# Patient Record
Sex: Female | Born: 1995 | Hispanic: No | Marital: Single | Smoking: Never smoker
Health system: Southern US, Community
[De-identification: ages and names within clinical notes are randomized; demographics above are authoritative.]

## PROBLEM LIST (undated history)

## (undated) ENCOUNTER — Inpatient Hospital Stay (HOSPITAL_COMMUNITY): Payer: Self-pay

## (undated) DIAGNOSIS — O09299 Supervision of pregnancy with other poor reproductive or obstetric history, unspecified trimester: Secondary | ICD-10-CM

## (undated) DIAGNOSIS — F419 Anxiety disorder, unspecified: Secondary | ICD-10-CM

## (undated) DIAGNOSIS — O139 Gestational [pregnancy-induced] hypertension without significant proteinuria, unspecified trimester: Secondary | ICD-10-CM

## (undated) DIAGNOSIS — O26899 Other specified pregnancy related conditions, unspecified trimester: Secondary | ICD-10-CM

## (undated) HISTORY — PX: WISDOM TOOTH EXTRACTION: SHX21

## (undated) HISTORY — DX: Gestational (pregnancy-induced) hypertension without significant proteinuria, unspecified trimester: O13.9

---
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
## 2016-04-05 ENCOUNTER — Ambulatory Visit (INDEPENDENT_AMBULATORY_CARE_PROVIDER_SITE_OTHER): Payer: BLUE CROSS/BLUE SHIELD

## 2016-04-05 ENCOUNTER — Ambulatory Visit (INDEPENDENT_AMBULATORY_CARE_PROVIDER_SITE_OTHER): Payer: BLUE CROSS/BLUE SHIELD | Admitting: Physician Assistant

## 2016-04-05 VITALS — BP 122/72 | HR 103 | Temp 98.1°F | Resp 17 | Ht 64.0 in | Wt 126.0 lb

## 2016-04-05 DIAGNOSIS — R1084 Generalized abdominal pain: Secondary | ICD-10-CM

## 2016-04-05 DIAGNOSIS — K5900 Constipation, unspecified: Secondary | ICD-10-CM

## 2016-04-05 MED ORDER — POLYETHYLENE GLYCOL 3350 17 GM/SCOOP PO POWD: 17.0000 g | Freq: Every day | ORAL | 1 refills | Status: DC | PRN

## 2016-04-05 NOTE — Patient Instructions (Addendum)
For constipation   Make sure you are drinking enough water daily. Make sure you are getting enough fiber in your diet - this will make you regular - you can eat high fiber foods or use metamucil as a supplement - it is really important to drink enough water when using fiber supplements.  If your stools are hard or are formed balls or you have to strain a stool softener will help - use colace 2-3 capsule a day  For gentle treatment of constipation Use Miralax 1-2 capfuls a day until your stools are soft and regular and then decrease the usage - you can use this daily  For more aggressive treatment of constipation Use 4 capfuls of Colace and 6 doses of Miralax and drink it in 2 hours - this should result in several watery stools - if it does not repeat the next day and then go to daily miralax for a week to make sure your bowels are clean and retrained to work properly  For the most aggressive treatment of constipation Use 14 capfuls of Miralax in 1 gallon of fluid (gatoraid or water work well or a combination of the two) and drink over 12h - it is ok to eat during this time and then use Miralax 1 capful daily for about 2 weeks to prevent the constipation from returning    IF you received an x-ray today, you will receive an invoice from [facility]. Please contact  Radiology at [phone] with questions or concerns regarding your invoice.   IF you received labwork today, you will receive an invoice from [facility]. Please contact [facility] at [phone] with questions or concerns regarding your invoice.   Our billing staff will not be able to assist you with questions regarding bills from these companies.  You will be contacted with the lab results as soon as they are available. The fastest way to get your results is to activate your My Chart account. Instructions are located on the last page of this paperwork. If you have not heard from us regarding the results in 2 weeks,  please contact this office.     

## 2016-04-05 NOTE — Progress Notes (Signed)
   Suzanne Flores  MRN: [id number] DOB: 06/01/1995  Subjective:  Pt presents to clinic with a week h/o generalized abd pain that has been getting worse.  Her appetite is down sometimes from nausea after eating and other times because she in not interested in eating. She will also have nausea not associated with food.  She has been been constipated since Nov - feels like she has to go but is able to go.  She has been drinking about 30oz water a day.  Rare soda.  No sick contacts with similar issues.  Recent laid off - 10ds ago - she has been stressed out because of that but she typicaly gets headaches from stress and not abdominal pain/nausea.  Review of Systems  Constitutional: Positive for chills. Negative for fever.  Gastrointestinal: Positive for abdominal pain, constipation and nausea. Negative for diarrhea and vomiting.  Genitourinary: Negative.     There are no active problems to display for this patient.   No current outpatient prescriptions on file prior to visit.   No current facility-administered medications on file prior to visit.     No Known Allergies  Pt patients past, family and social history were reviewed and updated.   Objective:  BP 122/72 (BP Location: Right Arm, Patient Position: Sitting, Cuff Size: Normal)   Pulse (!) 103   Temp 98.1 F (36.7 C) (Oral)   Resp 17   Ht 5\' 4"  (1.626 m)   Wt 126 lb (57.2 kg)   LMP 03/18/2016 (Approximate)   SpO2 98%   BMI 21.63 kg/m   Physical Exam  Constitutional: She is oriented to person, place, and time and well-developed, well-nourished, and in no distress.  HENT:  Head: Normocephalic and atraumatic.  Right Ear: Hearing and external ear normal.  Left Ear: Hearing and external ear normal.  Eyes: Conjunctivae are normal.  Neck: Normal range of motion.  Cardiovascular: Normal rate, regular rhythm and normal heart sounds.   No murmur heard. Pulmonary/Chest: Effort normal and breath sounds normal. She has no wheezes.    Abdominal: There is tenderness (generalized ).  Neurological: She is alert and oriented to person, place, and time. Gait normal.  Skin: Skin is warm and dry.  Psychiatric: Mood, memory, affect and judgment normal.  Vitals reviewed.   Dg Abd 1 View  Result Date: 04/05/2016 CLINICAL DATA:  Abdominal pain for 6 days. EXAM: ABDOMEN - 1 VIEW COMPARISON:  None. FINDINGS: The bowel gas pattern is normal. No radio-opaque calculi or other significant radiographic abnormality are seen. IMPRESSION: Normal exam. Electronically Signed   By: Drusilla Kannerhomas  Dalessio M.D.   On: 04/05/2016 09:49    Assessment and Plan :  Generalized abdominal pain - Plan: DG Abd 1 View  Constipation, unspecified constipation type - Plan: DG Abd 1 View, polyethylene glycol powder (GLYCOLAX/MIRALAX) powder - d/w pt how to treat her constipation - we discussed different ways of doing it and gave her information to take home about the different methods.  She will contact me and recheck if things are not getting better.  Benny LennertSarah Weber PA-C  [facility] at [facility] 04/05/2016 10:10 AM

## 2016-06-14 ENCOUNTER — Encounter (HOSPITAL_COMMUNITY): Payer: Self-pay

## 2016-06-14 ENCOUNTER — Emergency Department (HOSPITAL_COMMUNITY): Payer: BLUE CROSS/BLUE SHIELD

## 2016-06-14 ENCOUNTER — Emergency Department (HOSPITAL_COMMUNITY)
Admission: EM | Admit: 2016-06-14 | Discharge: 2016-06-14 | Disposition: A | Discharge status: Home / Self Care | Payer: BLUE CROSS/BLUE SHIELD | Attending: Emergency Medicine | Admitting: Emergency Medicine

## 2016-06-14 DIAGNOSIS — R1084 Generalized abdominal pain: Secondary | ICD-10-CM | POA: Insufficient documentation

## 2016-06-14 DIAGNOSIS — Z3A01 Less than 8 weeks gestation of pregnancy: Secondary | ICD-10-CM | POA: Insufficient documentation

## 2016-06-14 DIAGNOSIS — O26891 Other specified pregnancy related conditions, first trimester: Secondary | ICD-10-CM | POA: Diagnosis present

## 2016-06-14 DIAGNOSIS — R52 Pain, unspecified: Secondary | ICD-10-CM

## 2016-06-14 DIAGNOSIS — Z79899 Other long term (current) drug therapy: Secondary | ICD-10-CM | POA: Insufficient documentation

## 2016-06-14 DIAGNOSIS — Z3490 Encounter for supervision of normal pregnancy, unspecified, unspecified trimester: Secondary | ICD-10-CM

## 2016-06-14 LAB — COMPREHENSIVE METABOLIC PANEL
ALT: 13 U/L — ABNORMAL LOW (ref 14–54)
AST: 18 U/L (ref 15–41)
Albumin: 4.6 g/dL (ref 3.5–5.0)
Alkaline Phosphatase: 82 U/L (ref 38–126)
Anion gap: 8 (ref 5–15)
BUN: 9 mg/dL (ref 6–20)
CHLORIDE: 106 mmol/L (ref 101–111)
CO2: 23 mmol/L (ref 22–32)
Calcium: 10 mg/dL (ref 8.9–10.3)
Creatinine, Ser: 0.67 mg/dL (ref 0.44–1.00)
GFR calc Af Amer: >60 mL/min (ref >60)
GFR calc non Af Amer: >60 mL/min (ref >60)
Glucose, Bld: 91 mg/dL (ref 65–99)
POTASSIUM: 3.7 mmol/L (ref 3.5–5.1)
Sodium: 137 mmol/L (ref 135–145)
Total Bilirubin: 0.9 mg/dL (ref 0.3–1.2)
Total Protein: 8.5 g/dL — ABNORMAL HIGH (ref 6.5–8.1)

## 2016-06-14 LAB — URINALYSIS, ROUTINE W REFLEX MICROSCOPIC
BILIRUBIN URINE: NEGATIVE
Bacteria, UA: NONE SEEN
Glucose, UA: NEGATIVE mg/dL
Hgb urine dipstick: NEGATIVE
Ketones, ur: NEGATIVE mg/dL
Nitrite: NEGATIVE
PH: 7 (ref 5.0–8.0)
Protein, ur: NEGATIVE mg/dL
SPECIFIC GRAVITY, URINE: 1.011 (ref 1.005–1.030)

## 2016-06-14 LAB — WET PREP, GENITAL
CLUE CELLS WET PREP: NONE SEEN
SPERM: NONE SEEN
Trich, Wet Prep: NONE SEEN
Yeast Wet Prep HPF POC: NONE SEEN

## 2016-06-14 LAB — CBC
HCT: 40.3 % (ref 36.0–46.0)
Hemoglobin: 13.5 g/dL (ref 12.0–15.0)
MCH: 27.3 pg (ref 26.0–34.0)
MCHC: 33.5 g/dL (ref 30.0–36.0)
MCV: 81.6 fL (ref 78.0–100.0)
PLATELETS: 290 10*3/uL (ref 150–400)
RBC: 4.94 MIL/uL (ref 3.87–5.11)
RDW: 12.8 % (ref 11.5–15.5)
WBC: 8.9 10*3/uL (ref 4.0–10.5)

## 2016-06-14 LAB — I-STAT BETA HCG BLOOD, ED (MC, WL, AP ONLY): I-stat hCG, quantitative: 1071.9 m[IU]/mL — ABNORMAL HIGH (ref <5)

## 2016-06-14 LAB — LIPASE, BLOOD: Lipase: 22 U/L (ref 11–51)

## 2016-06-14 MED ORDER — SODIUM CHLORIDE 0.9 % IV BOLUS (SEPSIS)
1000.0000 mL | Freq: Once | INTRAVENOUS | Status: AC
  Administered 2016-06-14: 1000 mL via INTRAVENOUS

## 2016-06-14 MED ORDER — PRENATAL VITAMIN 27-0.8 MG PO TABS: 1.0000 | ORAL_TABLET | Freq: Every day | ORAL | 0 refills | Status: DC

## 2016-06-14 NOTE — ED Triage Notes (Signed)
Patient c/o mid abdominal pain x 6 days ago. Patient states she had nausea 2 days ago. No diarrhea no fever.

## 2016-06-14 NOTE — ED Notes (Signed)
Pt was assisted to restroom.

## 2016-06-14 NOTE — ED Notes (Signed)
Ultrasound at the bedside

## 2016-06-14 NOTE — ED Provider Notes (Signed)
[facility]-EMERGENCY DEPT Provider Note   CSN: [id number] Arrival date & time: 06/14/16  0911     History   Chief Complaint Chief Complaint  Patient presents with  . Abdominal Pain    HPI Suzanne Flores is a 21 y.o. female.  Pt presents to the ED today with diffuse abdominal pain.  She has had nausea, but not vomiting.  She denies constipation or diarrhea.  She denies f/c.      History reviewed. No pertinent past medical history.  There are no active problems to display for this patient.   History reviewed. No pertinent surgical history.  OB History    No data available       Home Medications    Prior to Admission medications   Medication Sig Start Date End Date Taking? Authorizing Provider  polyethylene glycol powder (GLYCOLAX/MIRALAX) powder Take 17 g by mouth daily as needed. Patient not taking: Reported on 06/14/2016 04/05/16   Morrell RiddleSarah L Weber, PA-C  Prenatal Vit-Fe Fumarate-FA (PRENATAL VITAMIN) 27-0.8 MG TABS Take 1 capsule by mouth daily. 06/14/16   Jacalyn LefevreJulie Lemario Chaikin, MD    Family History History reviewed. No pertinent family history.  Social History Social History  Substance Use Topics  . Smoking status: Never Smoker  . Smokeless tobacco: Never Used  . Alcohol use No     Allergies   Patient has no known allergies.   Review of Systems Review of Systems  Gastrointestinal: Positive for abdominal pain.  All other systems reviewed and are negative.    Physical Exam Updated Vital Signs BP 109/68   Pulse (!) 105   Temp 98.5 F (36.9 C) (Oral)   Resp 12   Ht 5\' 4"  (1.626 m)   Wt 125 lb (56.7 kg)   LMP 05/17/2016   SpO2 100%   BMI 21.46 kg/m   Physical Exam  Constitutional: She is oriented to person, place, and time. She appears well-developed and well-nourished.  HENT:  Head: Normocephalic and atraumatic.  Right Ear: External ear normal.  Left Ear: External ear normal.  Nose: Nose normal.  Mouth/Throat: Oropharynx is clear and moist.  Eyes:  Conjunctivae and EOM are normal. Pupils are equal, round, and reactive to light.  Neck: Normal range of motion. Neck supple.  Cardiovascular: Normal rate, regular rhythm, normal heart sounds and intact distal pulses.   Pulmonary/Chest: Effort normal and breath sounds normal.  Abdominal: Soft. Bowel sounds are normal. There is generalized tenderness.  Genitourinary: Cervix exhibits discharge. Cervix exhibits no motion tenderness. Right adnexum displays no tenderness. Left adnexum displays no tenderness. Vaginal discharge found.  Musculoskeletal: Normal range of motion.  Neurological: She is alert and oriented to person, place, and time.  Skin: Skin is warm.  Psychiatric: She has a normal mood and affect. Her behavior is normal. Judgment and thought content normal.  Nursing note and vitals reviewed.    ED Treatments / Results  Labs (all labs ordered are listed, but only abnormal results are displayed) Labs Reviewed  WET PREP, GENITAL - Abnormal; Notable for the following:       Result Value   WBC, Wet Prep HPF POC MANY (*)    All other components within normal limits  COMPREHENSIVE METABOLIC PANEL - Abnormal; Notable for the following:    Total Protein 8.5 (*)    ALT 13 (*)    All other components within normal limits  URINALYSIS, ROUTINE W REFLEX MICROSCOPIC - Abnormal; Notable for the following:    Leukocytes, UA MODERATE (*)  Squamous Epithelial / LPF 0-5 (*)    All other components within normal limits  I-STAT BETA HCG BLOOD, ED ([facility], [facility], [facility] ONLY) - Abnormal; Notable for the following:    I-stat hCG, quantitative 1,071.9 (*)    All other components within normal limits  LIPASE, BLOOD  CBC  GC/CHLAMYDIA PROBE AMP () NOT AT [facility]    EKG  EKG Interpretation None       Radiology US Ob Comp Less 14 Wks  Result Date: 06/14/2016 CLINICAL DATA:  Mid abdominal pain for 6 days, nausea for 2 days. Gestational age by last menstrual four weeks and 1 day. Beta HCG  1,072. EXAM: OBSTETRIC <14 WK [place] AND TRANSVAGINAL OB US TECHNIQUE: Both transabdominal and transvaginal ultrasound examinations were performed for complete evaluation of the gestation as well as the maternal uterus, adnexal regions, and pelvic cul-de-sac. Transvaginal technique was performed to assess early pregnancy. COMPARISON:  None. FINDINGS: Intrauterine gestational sac: Present Yolk sac:  Not present Embryo:  Not present Cardiac Activity: Not present MSD: 2.1  mm   4 w   6  d Subchorionic hemorrhage:  Small subchorionic hemorrhage. Maternal uterus/adnexae: Small to moderate amount of free fluid. 2.1 cm echogenic LEFT ovarian probable corpus luteal cyst with peripheral vascularity. IMPRESSION: Early intrauterine gestational sac, but no yolk sac, fetal pole, or cardiac activity yet visualized. Small subchorionic hemorrhage. Recommend follow-up quantitative B-HCG levels and follow-up US in 14 days to assess viability. This recommendation follows SRU consensus guidelines: Diagnostic Criteria for Nonviable Pregnancy Early in the First Trimester. Malva Limes Med 2013; 960:4540-98. Electronically Signed   By: Awilda Metro M.D.   On: 06/14/2016 13:49   US Ob Transvaginal  Result Date: 06/14/2016 CLINICAL DATA:  Mid abdominal pain for 6 days, nausea for 2 days. Gestational age by last menstrual four weeks and 1 day. Beta HCG 1,072. EXAM: OBSTETRIC <14 WK [place] AND TRANSVAGINAL OB US TECHNIQUE: Both transabdominal and transvaginal ultrasound examinations were performed for complete evaluation of the gestation as well as the maternal uterus, adnexal regions, and pelvic cul-de-sac. Transvaginal technique was performed to assess early pregnancy. COMPARISON:  None. FINDINGS: Intrauterine gestational sac: Present Yolk sac:  Not present Embryo:  Not present Cardiac Activity: Not present MSD: 2.1  mm   4 w   6  d Subchorionic hemorrhage:  Small subchorionic hemorrhage. Maternal uterus/adnexae: Small to moderate amount of  free fluid. 2.1 cm echogenic LEFT ovarian probable corpus luteal cyst with peripheral vascularity. IMPRESSION: Early intrauterine gestational sac, but no yolk sac, fetal pole, or cardiac activity yet visualized. Small subchorionic hemorrhage. Recommend follow-up quantitative B-HCG levels and follow-up US in 14 days to assess viability. This recommendation follows SRU consensus guidelines: Diagnostic Criteria for Nonviable Pregnancy Early in the First Trimester. Malva Limes Med 2013; 119:1478-29. Electronically Signed   By: Awilda Metro M.D.   On: 06/14/2016 13:49    Procedures Procedures (including critical care time)  Medications Ordered in ED Medications  sodium chloride 0.9 % bolus 1,000 mL (0 mLs Intravenous Stopped 06/14/16 1250)     Initial Impression / Assessment and Plan / ED Course  I have reviewed the triage vital signs and the nursing notes.  Pertinent labs & imaging results that were available during my care of the patient were reviewed by me and considered in my medical decision making (see chart for details).   After I told the patient that her pregnancy test was positive, she said that she and her boyfriend  were trying.  The pt did take a home pregnancy test that was positive.  Pt is not having any vaginal bleeding.  She knows to follow up with ob.    Final Clinical Impressions(s) / ED Diagnoses   Final diagnoses:  Intrauterine pregnancy    New Prescriptions New Prescriptions   PRENATAL VIT-FE FUMARATE-FA (PRENATAL VITAMIN) 27-0.8 MG TABS    Take 1 capsule by mouth daily.     Jacalyn Lefevre, MD 06/14/16 [phone]

## 2016-07-26 ENCOUNTER — Encounter: Payer: BLUE CROSS/BLUE SHIELD | Admitting: Certified Nurse Midwife

## 2016-08-10 ENCOUNTER — Encounter: Payer: BLUE CROSS/BLUE SHIELD | Admitting: Certified Nurse Midwife

## 2016-11-23 DIAGNOSIS — O99019 Anemia complicating pregnancy, unspecified trimester: Secondary | ICD-10-CM | POA: Insufficient documentation

## 2017-01-13 ENCOUNTER — Encounter (HOSPITAL_COMMUNITY): Payer: Self-pay

## 2017-01-13 ENCOUNTER — Inpatient Hospital Stay (HOSPITAL_COMMUNITY)
Admission: AD | Admit: 2017-01-13 | Discharge: 2017-01-13 | Disposition: A | Discharge status: Home / Self Care | Payer: BLUE CROSS/BLUE SHIELD | Source: Ambulatory Visit | Attending: Obstetrics and Gynecology | Admitting: Obstetrics and Gynecology

## 2017-01-13 DIAGNOSIS — R109 Unspecified abdominal pain: Secondary | ICD-10-CM | POA: Insufficient documentation

## 2017-01-13 DIAGNOSIS — F329 Major depressive disorder, single episode, unspecified: Secondary | ICD-10-CM

## 2017-01-13 DIAGNOSIS — Z3A34 34 weeks gestation of pregnancy: Secondary | ICD-10-CM | POA: Insufficient documentation

## 2017-01-13 DIAGNOSIS — O4703 False labor before 37 completed weeks of gestation, third trimester: Secondary | ICD-10-CM

## 2017-01-13 DIAGNOSIS — R4589 Other symptoms and signs involving emotional state: Secondary | ICD-10-CM

## 2017-01-13 DIAGNOSIS — O26893 Other specified pregnancy related conditions, third trimester: Secondary | ICD-10-CM | POA: Insufficient documentation

## 2017-01-13 HISTORY — DX: Anxiety disorder, unspecified: F41.9

## 2017-01-13 LAB — CBC
HCT: 33.3 % — ABNORMAL LOW (ref 36.0–46.0)
Hemoglobin: 11.1 g/dL — ABNORMAL LOW (ref 12.0–15.0)
MCH: 27.1 pg (ref 26.0–34.0)
MCHC: 33.3 g/dL (ref 30.0–36.0)
MCV: 81.4 fL (ref 78.0–100.0)
Platelets: 279 10*3/uL (ref 150–400)
RBC: 4.09 MIL/uL (ref 3.87–5.11)
RDW: 13.2 % (ref 11.5–15.5)
WBC: 10.4 10*3/uL (ref 4.0–10.5)

## 2017-01-13 LAB — URINALYSIS, ROUTINE W REFLEX MICROSCOPIC
Bilirubin Urine: NEGATIVE
Glucose, UA: NEGATIVE mg/dL
Hgb urine dipstick: NEGATIVE
Ketones, ur: NEGATIVE mg/dL
Nitrite: NEGATIVE
Protein, ur: NEGATIVE mg/dL
Specific Gravity, Urine: 1.018 (ref 1.005–1.030)
pH: 6 (ref 5.0–8.0)

## 2017-01-13 MED ORDER — SERTRALINE HCL 25 MG PO TABS: 25.0000 mg | ORAL_TABLET | Freq: Every day | ORAL | 2 refills | Status: DC

## 2017-01-13 NOTE — MAU Provider Note (Signed)
History     CSN: [id number]  Arrival date & time 01/13/17  0920   None     Chief Complaint  Patient presents with  . Abdominal Pain    Suzanne Flores 21 y.o. G1P0 @ 3969w3d presents to MAU stating that she has had contractions from 2 pm yesterday until midnight last night. She states her contractions have spaced out and eased off since then.    Past Medical History:  Diagnosis Date  . Anxiety     Past Surgical History:  Procedure Laterality Date  . WISDOM TOOTH EXTRACTION      No family history on file.  Social History  Substance Use Topics  . Smoking status: Never Smoker  . Smokeless tobacco: Never Used  . Alcohol use No    OB History    Gravida Para Term Preterm AB Living   1             SAB TAB Ectopic Multiple Live Births                  Review of Systems  Gastrointestinal: Positive for abdominal pain.  All other systems reviewed and are negative.   Allergies  Patient has no known allergies.  Home Medications    BP 112/71   Pulse (!) 133   Temp 97.9 F (36.6 C) (Oral)   Resp 18   Ht 5\' 3"  (1.6 m)   Wt 175 lb (79.4 kg)   LMP 05/17/2016   BMI 31.00 kg/m   Physical Exam  Constitutional: She is oriented to person, place, and time. She appears well-developed and well-nourished.  HENT:  Head: Normocephalic and atraumatic.  Cardiovascular: Normal rate.   Pulmonary/Chest: Effort normal and breath sounds normal.  Abdominal: Soft.  Genitourinary: Vagina normal. No vaginal discharge found.  Musculoskeletal: Normal range of motion.  Neurological: She is alert and oriented to person, place, and time.  Skin: Skin is warm and dry.  Psychiatric: She has a normal mood and affect. Her behavior is normal. Thought content normal.  Nursing note and vitals reviewed.   MAU Course  Procedures (including critical care time)  Labs Reviewed  URINALYSIS, ROUTINE W REFLEX MICROSCOPIC - Abnormal; Notable for the following:       Result Value   Leukocytes, UA  TRACE (*)    Bacteria, UA RARE (*)    Squamous Epithelial / LPF 6-30 (*)    All other components within normal limits  CBC - Abnormal; Notable for the following:    Hemoglobin 11.1 (*)    HCT 33.3 (*)    All other components within normal limits   No results found.   No diagnosis found.    MDM  Pt has been monitored with no tracing of contractions since arrival. FHT's category 1. Cervix is closed/50/-3. Encouraged pushing fluids and resting today. Pt discussed having depressed mood  Most of the time. Denies feeling of suicide and harming oneself. Advised to start Zoloft at 25mg  and discuss at next office visit next Thursday. Pt is agreeable with plan Illene BolusLori Clemmons CNM

## 2017-01-13 NOTE — Discharge Instructions (Signed)
Sertraline oral solution What is this medicine? SERTRALINE (SER tra leen) is used to treat depression. It may also be used to treat obsessive compulsive disorder, panic disorder, post-trauma stress, premenstrual dysphoric disorder (PMDD) or social anxiety. This medicine may be used for other purposes; ask your health care provider or pharmacist if you have questions. COMMON BRAND NAME(S): Zoloft What should I tell my health care provider before I take this medicine? They need to know if you have any of these conditions: -bleeding disorders -bipolar disorder or a family history of bipolar disorder -glaucoma -heart disease -high blood pressure -history of irregular heartbeat -history of low levels of calcium, magnesium, or potassium in the blood -if you often drink alcohol -liver disease -receiving electroconvulsive therapy -seizures -suicidal thoughts, plans, or attempt; a previous suicide attempt by you or a family member -take medicines that treat or prevent blood clots -thyroid disease -an unusual or allergic reaction to sertraline, other medicines, foods, dyes, or preservatives -pregnant or trying to get pregnant -breast-feeding How should I use this medicine? Take this medicine by mouth. Follow the directions on the prescription label. Before taking your dose, you need to dilute the solution in a beverage. Measure your medicine dose using the dropper in the bottle. Next, place the measured dose in at least 4 ounces (one-half cup) of water, ginger-ale, lemon-lime soda, lemonade or orange juice and mix. Do not mix in grapefruit juice or in any other liquids other than those listed. Drink all of mixed liquid immediately. Do not mix the dose and store it for later. It must be taken right away. You may take this medicine with or without food. Take your medicine at regular intervals. Do not take your medicine more often than directed. Do not stop taking this medicine suddenly except upon the  advice of your doctor. Stopping this medicine too quickly may cause serious side effects or your condition may worsen. A special MedGuide will be given to you by the pharmacist with each prescription and refill. Be sure to read this information carefully each time. Talk to your pediatrician regarding the use of this medicine in children. While this drug may be prescribed for children as young as 7 years for selected conditions, precautions do apply. Overdosage: If you think you have taken too much of this medicine contact a poison control center or emergency room at once. NOTE: This medicine is only for you. Do not share this medicine with others. What if I miss a dose? If you miss a dose, take it as soon as you can. If it is almost time for your next dose, take only that dose. Do not take double or extra doses. What may interact with this medicine? Do not take this medicine with any of the following medications: -cisapride -dofetilide -dronedarone -linezolid -MAOIs like Carbex, Eldepryl, Marplan, Nardil, and Parnate -methylene blue (injected into a vein) -pimozide -thioridazine This medicine may also interact with the following medications: -alcohol -amphetamines -aspirin and aspirin-like medicines -certain medicines for depression, anxiety, or psychotic disturbances -certain medicines for fungal infections like ketoconazole, fluconazole, posaconazole, and itraconazole -certain medicines for irregular heart beat like flecainide, quinidine, propafenone -certain medicines for migraine headaches like almotriptan, eletriptan, frovatriptan, naratriptan, rizatriptan, sumatriptan, zolmitriptan -certain medicines for sleep -certain medicines for seizures like carbamazepine, valproic acid, phenytoin -certain medicines that treat or prevent blood clots like warfarin, enoxaparin, dalteparin -cimetidine -digoxin -diuretics -fentanyl -isoniazid -lithium -NSAIDs, medicines for pain and  inflammation, like ibuprofen or naproxen -other medicines that prolong the QT  interval (cause an abnormal heart rhythm) -rasagiline -safinamide -supplements like St. John's wort, kava kava, valerian -tolbutamide -tramadol -tryptophan This list may not describe all possible interactions. Give your health care provider a list of all the medicines, herbs, non-prescription drugs, or dietary supplements you use. Also tell them if you smoke, drink alcohol, or use illegal drugs. Some items may interact with your medicine. What should I watch for while using this medicine? Tell your doctor if your symptoms do not get better or if they get worse. Visit your doctor or health care professional for regular checks on your progress. Because it may take several weeks to see the full effects of this medicine, it is important to continue your treatment as prescribed by your doctor. Patients and their families should watch out for new or worsening thoughts of suicide or depression. Also watch out for sudden changes in feelings such as feeling anxious, agitated, panicky, irritable, hostile, aggressive, impulsive, severely restless, overly excited and hyperactive, or not being able to sleep. If this happens, especially at the beginning of treatment or after a change in dose, call your health care professional. Bonita Quin may get drowsy or dizzy. Do not drive, use machinery, or do anything that needs mental alertness until you know how this medicine affects you. Do not stand or sit up quickly, especially if you are an older patient. This reduces the risk of dizzy or fainting spells. Alcohol may interfere with the effect of this medicine. Avoid alcoholic drinks. This medicine contains a high percentage of alcohol that may interact with medicines used to treat alcohol abuse, like Antabuse (disulfiram). You should not take these medicines together. Your mouth may get dry. Chewing sugarless gum or sucking hard candy, and drinking  plenty of water may help. Contact your doctor if the problem does not go away or is severe. Some products may contain alcohol. Ask your pharmacist or healthcare provider if this medicine contains alcohol. Be sure to tell all healthcare providers you are taking this medicine. Certain medicines, like metronidazole and disulfiram, can cause an unpleasant reaction when taken with alcohol. The reaction includes flushing, headache, nausea, vomiting, sweating, and increased thirst. The reaction can last from 30 minutes to several hours. What side effects may I notice from receiving this medicine? Side effects that you should report to your doctor or health care professional as soon as possible: -allergic reactions like skin rash, itching or hives, swelling of the face, lips, or tongue -anxious -black, tarry stools -changes in vision -confusion -elevated mood, decreased need for sleep, racing thoughts, impulsive behavior -eye pain -fast, irregular heartbeat -feeling faint or lightheaded, falls -feeling agitated, angry, or irritable -hallucination, loss of contact with reality -loss of balance or coordination -loss of memory -painful or prolonged erections -restlessness, pacing, inability to keep still -seizures -stiff muscles -suicidal thoughts or other mood changes -trouble sleeping -unusual bleeding or bruising -unusually weak or tired -vomiting Side effects that usually do not require medical attention (report to your doctor or health care professional if they continue or are bothersome): -change in appetite or weight -change in sex drive or performance -diarrhea -increased sweating -indigestion, nausea -tremors This list may not describe all possible side effects. Call your doctor for medical advice about side effects. You may report side effects to FDA at 1-800-FDA-1088. Where should I keep my medicine? Keep out of the reach of children. Store at room temperature between 15 and 30  degrees C (59 and 86 degrees F). Throw away any unused medicine  after the expiration date. NOTE: This sheet is a summary. It may not cover all possible information. If you have questions about this medicine, talk to your doctor, pharmacist, or health care provider.  2018 Elsevier/Gold Standard (2016-03-11 13:50:42) Preventing Preterm Birth Preterm birth is when your baby is delivered between 20 weeks and 37 weeks of pregnancy. A full-term pregnancy lasts for at least 37 weeks. Preterm birth can be dangerous for your baby because the last few weeks of pregnancy are an important time for your baby's brain and lungs to grow. Many things can cause a baby to be born early. Sometimes the cause is not known. There are certain factors that make you more likely to experience preterm birth, such as:  Having a previous baby born preterm.  Being pregnant with twins or other multiples.  Having had fertility treatment.  Being overweight or underweight at the start of your pregnancy.  Having any of the following during pregnancy: ? An infection, including a urinary tract infection (UTI) or an STI (sexually transmitted infection). ? High blood pressure. ? Diabetes. ? Vaginal bleeding.  Being age 35 or older.  Being age 55 or younger.  Getting pregnant within 6 months of a previous pregnancy.  Suffering extreme stress or physical or emotional abuse during pregnancy.  Standing for long periods of time during pregnancy, such as working at a job that requires standing.  What are the risks? The most serious risk of preterm birth is that the baby may not survive. This is more likely to happen if a baby is born before 34 weeks. Other risks and complications of preterm birth may include your baby having:  Breathing problems.  Brain damage that affects movement and coordination (cerebral palsy).  Feeding difficulties.  Vision or hearing problems.  Infections or inflammation of the digestive tract  (colitis).  Developmental delays.  Learning disabilities.  Higher risk for diabetes, heart disease, and high blood pressure later in life.  What can I do to lower my risk? Medical care  The most important thing you can do to lower your risk for preterm birth is to get routine medical care during pregnancy (prenatal care). If you have a high risk of preterm birth, you may be referred to a health care provider who specializes in managing high-risk pregnancies (perinatologist). You may be given medicine to help prevent preterm birth. Lifestyle changes Certain lifestyle changes can also lower your risk of preterm birth:  Wait at least 6 months after a pregnancy to become pregnant again.  Try to plan pregnancy for when you are between 18 and 79 years old.  Get to a healthy weight before getting pregnant. If you are overweight, work with your health care provider to safely lose weight.  Do not use any products that contain nicotine or tobacco, such as cigarettes and e-cigarettes. If you need help quitting, ask your health care provider.  Do not drink alcohol.  Do not use drugs.  Where to find support: For more support, consider:  Talking with your health care provider.  Talking with a therapist or substance abuse counselor, if you need help quitting.  Working with a diet and nutrition specialist (dietitian) or a Systems analyst to maintain a healthy weight.  Joining a support group.  Where to find more information: Learn more about preventing preterm birth from:  Centers for Disease Control and Prevention: http://curry.org/  March of Dimes: marchofdimes.org/complications/premature-babies.aspx  American Pregnancy Association: americanpregnancy.org/labor-and-birth/premature-labor  Contact a health care provider if:  You have  any of the following signs of preterm labor before 37 weeks: ? A change or increase in vaginal  discharge. ? Fluid leaking from your vagina. ? Pressure or cramps in your lower abdomen. ? A backache that does not go away or gets worse. ? Regular tightening (contractions) in your lower abdomen. Summary  Preterm birth means having your baby during weeks 20-37 of pregnancy.  Preterm birth may put your baby at risk for physical and mental problems.  Getting good prenatal care can help prevent preterm birth.  You can lower your risk of preterm birth by making certain lifestyle changes, such as not smoking and not using alcohol. This information is not intended to replace advice given to you by your health care provider. Make sure you discuss any questions you have with your health care provider. Document Released: 04/21/2015 Document Revised: 11/14/2015 Document Reviewed: 11/14/2015 Elsevier Interactive Patient Education  [employer]2018 Elsevier Inc.

## 2017-01-13 NOTE — MAU Note (Signed)
Patient given water encourage to drink.

## 2017-01-13 NOTE — MAU Note (Addendum)
Intermittent pain from mid thigh to upper abdomen, denies N/V/D, denies vaginal bleeding.   Patient experiencing feeling sadness doesn't want to be around anyone.  Prior to pregnancy was on anxiety medications.

## 2017-01-27 LAB — OB RESULTS CONSOLE GBS: STREP GROUP B AG: NEGATIVE

## 2017-02-17 ENCOUNTER — Inpatient Hospital Stay (HOSPITAL_COMMUNITY): Payer: Medicaid Other | Admitting: Anesthesiology

## 2017-02-17 ENCOUNTER — Inpatient Hospital Stay (HOSPITAL_COMMUNITY)
Admission: AD | Admit: 2017-02-17 | Discharge: 2017-02-20 | DRG: 786 | Disposition: A | Discharge status: Home / Self Care | Payer: Medicaid Other | Source: Ambulatory Visit | Attending: Obstetrics and Gynecology | Admitting: Obstetrics and Gynecology

## 2017-02-17 ENCOUNTER — Encounter (HOSPITAL_COMMUNITY): Payer: Self-pay | Admitting: Anesthesiology

## 2017-02-17 ENCOUNTER — Other Ambulatory Visit: Payer: Self-pay

## 2017-02-17 ENCOUNTER — Encounter (HOSPITAL_COMMUNITY)
Admission: AD | Disposition: A | Discharge status: Home / Self Care | Payer: Self-pay | Source: Ambulatory Visit | Attending: Obstetrics and Gynecology

## 2017-02-17 ENCOUNTER — Encounter (HOSPITAL_COMMUNITY): Payer: Self-pay

## 2017-02-17 DIAGNOSIS — O1494 Unspecified pre-eclampsia, complicating childbirth: Principal | ICD-10-CM | POA: Diagnosis present

## 2017-02-17 DIAGNOSIS — Z98891 History of uterine scar from previous surgery: Secondary | ICD-10-CM

## 2017-02-17 DIAGNOSIS — Z3483 Encounter for supervision of other normal pregnancy, third trimester: Secondary | ICD-10-CM | POA: Diagnosis present

## 2017-02-17 DIAGNOSIS — O411290 Chorioamnionitis, unspecified trimester, not applicable or unspecified: Secondary | ICD-10-CM | POA: Diagnosis not present

## 2017-02-17 DIAGNOSIS — Z3A39 39 weeks gestation of pregnancy: Secondary | ICD-10-CM | POA: Diagnosis not present

## 2017-02-17 DIAGNOSIS — O411230 Chorioamnionitis, third trimester, not applicable or unspecified: Secondary | ICD-10-CM | POA: Diagnosis present

## 2017-02-17 DIAGNOSIS — O1490 Unspecified pre-eclampsia, unspecified trimester: Secondary | ICD-10-CM | POA: Diagnosis present

## 2017-02-17 LAB — CBC
HCT: 34.8 % — ABNORMAL LOW (ref 36.0–46.0)
Hemoglobin: 11.2 g/dL — ABNORMAL LOW (ref 12.0–15.0)
MCH: 26.2 pg (ref 26.0–34.0)
MCHC: 32.2 g/dL (ref 30.0–36.0)
MCV: 81.5 fL (ref 78.0–100.0)
Platelets: 304 10*3/uL (ref 150–400)
RBC: 4.27 MIL/uL (ref 3.87–5.11)
RDW: 14.5 % (ref 11.5–15.5)
WBC: 10.5 10*3/uL (ref 4.0–10.5)

## 2017-02-17 LAB — TYPE AND SCREEN
ABO/RH(D): O POS
Antibody Screen: NEGATIVE

## 2017-02-17 LAB — PROTEIN / CREATININE RATIO, URINE
CREATININE, URINE: 71 mg/dL
Protein Creatinine Ratio: 0.44 mg/mg{Cre} — ABNORMAL HIGH (ref 0.00–0.15)
Total Protein, Urine: 31 mg/dL

## 2017-02-17 LAB — ABO/RH: ABO/RH(D): O POS

## 2017-02-17 LAB — POCT FERN TEST: POCT Fern Test: POSITIVE

## 2017-02-17 LAB — RPR: RPR Ser Ql: NONREACTIVE

## 2017-02-17 SURGERY — Surgical Case
Anesthesia: Epidural

## 2017-02-17 MED ORDER — OXYTOCIN 10 UNIT/ML IJ SOLN
INTRAMUSCULAR | Status: AC
  Filled 2017-02-17: qty 4

## 2017-02-17 MED ORDER — TERBUTALINE SULFATE 1 MG/ML IJ SOLN: 0.2500 mg | Freq: Once | INTRAMUSCULAR | Status: DC | PRN

## 2017-02-17 MED ORDER — LACTATED RINGERS IV SOLN
500.0000 mL | Freq: Once | INTRAVENOUS | Status: AC
  Administered 2017-02-17: 500 mL via INTRAVENOUS

## 2017-02-17 MED ORDER — SCOPOLAMINE 1 MG/3DAYS TD PT72
MEDICATED_PATCH | TRANSDERMAL | Status: DC | PRN
  Administered 2017-02-17: 1 via TRANSDERMAL

## 2017-02-17 MED ORDER — MORPHINE SULFATE (PF) 0.5 MG/ML IJ SOLN
INTRAMUSCULAR | Status: DC | PRN
  Administered 2017-02-17: 4 mg via EPIDURAL

## 2017-02-17 MED ORDER — KETOROLAC TROMETHAMINE 30 MG/ML IJ SOLN: 30.0000 mg | Freq: Four times a day (QID) | INTRAMUSCULAR | Status: AC | PRN

## 2017-02-17 MED ORDER — EPHEDRINE 5 MG/ML INJ: 10.0000 mg | INTRAVENOUS | Status: DC | PRN

## 2017-02-17 MED ORDER — SODIUM BICARBONATE 8.4 % IV SOLN
INTRAVENOUS | Status: DC | PRN
  Administered 2017-02-17: 10 mL via EPIDURAL
  Administered 2017-02-17: 2 mL via EPIDURAL

## 2017-02-17 MED ORDER — CEFAZOLIN SODIUM-DEXTROSE 2-3 GM-%(50ML) IV SOLR
INTRAVENOUS | Status: AC
  Filled 2017-02-17: qty 100

## 2017-02-17 MED ORDER — ACETAMINOPHEN 160 MG/5ML PO SOLN: 1000.0000 mg | Freq: Once | ORAL | Status: DC

## 2017-02-17 MED ORDER — FENTANYL CITRATE (PF) 100 MCG/2ML IJ SOLN: 25.0000 ug | INTRAMUSCULAR | Status: DC | PRN

## 2017-02-17 MED ORDER — LIDOCAINE HCL (PF) 1 % IJ SOLN
30.0000 mL | INTRAMUSCULAR | Status: DC | PRN
  Filled 2017-02-17: qty 30

## 2017-02-17 MED ORDER — OXYTOCIN 40 UNITS IN LACTATED RINGERS INFUSION - SIMPLE MED: 2.5000 [IU]/h | INTRAVENOUS | Status: DC

## 2017-02-17 MED ORDER — PHENYLEPHRINE 40 MCG/ML (10ML) SYRINGE FOR IV PUSH (FOR BLOOD PRESSURE SUPPORT): 80.0000 ug | PREFILLED_SYRINGE | INTRAVENOUS | Status: DC | PRN

## 2017-02-17 MED ORDER — ONDANSETRON HCL 4 MG/2ML IJ SOLN
4.0000 mg | Freq: Four times a day (QID) | INTRAMUSCULAR | Status: DC | PRN
  Administered 2017-02-17: 4 mg via INTRAVENOUS
  Filled 2017-02-17: qty 2

## 2017-02-17 MED ORDER — DIPHENHYDRAMINE HCL 50 MG/ML IJ SOLN: 12.5000 mg | INTRAMUSCULAR | Status: DC | PRN

## 2017-02-17 MED ORDER — FENTANYL 2.5 MCG/ML BUPIVACAINE 1/10 % EPIDURAL INFUSION (WH - ANES)
14.0000 mL/h | INTRAMUSCULAR | Status: DC | PRN
  Administered 2017-02-17 (×2): 14 mL/h via EPIDURAL
  Filled 2017-02-17 (×2): qty 100

## 2017-02-17 MED ORDER — SOD CITRATE-CITRIC ACID 500-334 MG/5ML PO SOLN
30.0000 mL | ORAL | Status: DC | PRN
  Administered 2017-02-17: 30 mL via ORAL
  Filled 2017-02-17: qty 15

## 2017-02-17 MED ORDER — LACTATED RINGERS IV SOLN
INTRAVENOUS | Status: DC | PRN
  Administered 2017-02-17: 22:00:00 via INTRAVENOUS

## 2017-02-17 MED ORDER — MEPERIDINE HCL 25 MG/ML IJ SOLN
INTRAMUSCULAR | Status: AC
  Filled 2017-02-17: qty 1

## 2017-02-17 MED ORDER — PROMETHAZINE HCL 25 MG/ML IJ SOLN: 6.2500 mg | INTRAMUSCULAR | Status: DC | PRN

## 2017-02-17 MED ORDER — ONDANSETRON HCL 4 MG/2ML IJ SOLN
INTRAMUSCULAR | Status: DC | PRN
  Administered 2017-02-17: 4 mg via INTRAVENOUS

## 2017-02-17 MED ORDER — PHENYLEPHRINE 40 MCG/ML (10ML) SYRINGE FOR IV PUSH (FOR BLOOD PRESSURE SUPPORT)
80.0000 ug | PREFILLED_SYRINGE | INTRAVENOUS | Status: DC | PRN
  Filled 2017-02-17: qty 10

## 2017-02-17 MED ORDER — SODIUM CHLORIDE 0.9 % IV SOLN
3.0000 g | Freq: Four times a day (QID) | INTRAVENOUS | Status: DC
  Administered 2017-02-17: 3 g via INTRAVENOUS
  Filled 2017-02-17: qty 3

## 2017-02-17 MED ORDER — SODIUM BICARBONATE 8.4 % IV SOLN
INTRAVENOUS | Status: AC
  Filled 2017-02-17: qty 50

## 2017-02-17 MED ORDER — MEPERIDINE HCL 25 MG/ML IJ SOLN
INTRAMUSCULAR | Status: DC | PRN
  Administered 2017-02-17: 12.5 mg via INTRAVENOUS

## 2017-02-17 MED ORDER — SODIUM CHLORIDE 0.9 % IR SOLN
Status: DC | PRN
  Administered 2017-02-17: 700 mL

## 2017-02-17 MED ORDER — SODIUM CHLORIDE 0.9 % IR SOLN
Status: DC | PRN
  Administered 2017-02-17: 1000 mL

## 2017-02-17 MED ORDER — ESMOLOL HCL 100 MG/10ML IV SOLN
INTRAVENOUS | Status: AC
  Filled 2017-02-17: qty 10

## 2017-02-17 MED ORDER — ACETAMINOPHEN 325 MG PO TABS
650.0000 mg | ORAL_TABLET | ORAL | Status: DC | PRN
Start: 2017-02-17 — End: 2017-02-18

## 2017-02-17 MED ORDER — LACTATED RINGERS IV SOLN: 500.0000 mL | Freq: Once | INTRAVENOUS | Status: DC

## 2017-02-17 MED ORDER — LACTATED RINGERS IV SOLN
500.0000 mL | INTRAVENOUS | Status: DC | PRN
Start: 2017-02-17 — End: 2017-02-18
  Administered 2017-02-17: 500 mL via INTRAVENOUS

## 2017-02-17 MED ORDER — MORPHINE SULFATE (PF) 0.5 MG/ML IJ SOLN
INTRAMUSCULAR | Status: AC
  Filled 2017-02-17: qty 10

## 2017-02-17 MED ORDER — OXYTOCIN 40 UNITS IN LACTATED RINGERS INFUSION - SIMPLE MED: 1.0000 m[IU]/min | INTRAVENOUS | Status: DC

## 2017-02-17 MED ORDER — CEFAZOLIN SODIUM-DEXTROSE 2-4 GM/100ML-% IV SOLN
2.0000 g | Freq: Once | INTRAVENOUS | Status: AC
  Administered 2017-02-17: 2 g via INTRAVENOUS

## 2017-02-17 MED ORDER — FENTANYL CITRATE (PF) 100 MCG/2ML IJ SOLN
100.0000 ug | INTRAMUSCULAR | Status: DC | PRN
  Administered 2017-02-17: 100 ug via INTRAVENOUS
  Filled 2017-02-17: qty 2

## 2017-02-17 MED ORDER — OXYTOCIN 40 UNITS IN LACTATED RINGERS INFUSION - SIMPLE MED
1.0000 m[IU]/min | INTRAVENOUS | Status: DC
  Administered 2017-02-17: 2 m[IU]/min via INTRAVENOUS
  Filled 2017-02-17: qty 1000

## 2017-02-17 MED ORDER — PHENYLEPHRINE 40 MCG/ML (10ML) SYRINGE FOR IV PUSH (FOR BLOOD PRESSURE SUPPORT)
PREFILLED_SYRINGE | INTRAVENOUS | Status: AC
  Filled 2017-02-17: qty 10

## 2017-02-17 MED ORDER — LIDOCAINE HCL (PF) 1 % IJ SOLN
INTRAMUSCULAR | Status: DC | PRN
  Administered 2017-02-17 (×2): 5 mL via EPIDURAL

## 2017-02-17 MED ORDER — ONDANSETRON HCL 4 MG/2ML IJ SOLN
INTRAMUSCULAR | Status: AC
  Filled 2017-02-17: qty 4

## 2017-02-17 MED ORDER — DEXTROSE 5 % IV SOLN
500.0000 mg | INTRAVENOUS | Status: DC
  Administered 2017-02-17: 500 mg via INTRAVENOUS
  Filled 2017-02-17: qty 500

## 2017-02-17 MED ORDER — LACTATED RINGERS IV SOLN
INTRAVENOUS | Status: DC
  Administered 2017-02-17: 18:00:00 via INTRAVENOUS
  Administered 2017-02-17: 125 mL/h via INTRAVENOUS
  Administered 2017-02-17 (×2): via INTRAVENOUS

## 2017-02-17 MED ORDER — ESMOLOL HCL 100 MG/10ML IV SOLN
INTRAVENOUS | Status: DC | PRN
  Administered 2017-02-17 (×2): 10 mg via INTRAVENOUS

## 2017-02-17 MED ORDER — ACETAMINOPHEN 160 MG/5ML PO SOLN
650.0000 mg | Freq: Once | ORAL | Status: AC
  Administered 2017-02-17: 650 mg via ORAL
  Filled 2017-02-17: qty 20.3

## 2017-02-17 MED ORDER — SCOPOLAMINE 1 MG/3DAYS TD PT72
MEDICATED_PATCH | TRANSDERMAL | Status: AC
  Filled 2017-02-17: qty 1

## 2017-02-17 MED ORDER — MEPERIDINE HCL 25 MG/ML IJ SOLN: 6.2500 mg | INTRAMUSCULAR | Status: DC | PRN

## 2017-02-17 MED ORDER — PHENYLEPHRINE HCL 10 MG/ML IJ SOLN
INTRAMUSCULAR | Status: DC | PRN
  Administered 2017-02-17 (×3): 80 ug via INTRAVENOUS
  Administered 2017-02-17 (×2): 40 ug via INTRAVENOUS

## 2017-02-17 MED ORDER — DEXAMETHASONE SODIUM PHOSPHATE 4 MG/ML IJ SOLN
INTRAMUSCULAR | Status: AC
  Filled 2017-02-17: qty 1

## 2017-02-17 MED ORDER — EPHEDRINE 5 MG/ML INJ
10.0000 mg | INTRAVENOUS | Status: DC | PRN
Start: 2017-02-17 — End: 2017-02-18

## 2017-02-17 MED ORDER — LIDOCAINE-EPINEPHRINE (PF) 2 %-1:200000 IJ SOLN
INTRAMUSCULAR | Status: AC
  Filled 2017-02-17: qty 20

## 2017-02-17 MED ORDER — DEXAMETHASONE SODIUM PHOSPHATE 4 MG/ML IJ SOLN
INTRAMUSCULAR | Status: DC | PRN
  Administered 2017-02-17: 4 mg via INTRAVENOUS

## 2017-02-17 MED ORDER — OXYTOCIN BOLUS FROM INFUSION: 500.0000 mL | Freq: Once | INTRAVENOUS | Status: DC

## 2017-02-17 MED ORDER — OXYTOCIN 10 UNIT/ML IJ SOLN
INTRAVENOUS | Status: DC | PRN
  Administered 2017-02-17: 40 [IU] via INTRAVENOUS

## 2017-02-17 SURGICAL SUPPLY — 38 items
BARRIER ADHS 3X4 INTERCEED (GAUZE/BANDAGES/DRESSINGS) ×3 IMPLANT
BENZOIN TINCTURE PRP APPL 2/3 (GAUZE/BANDAGES/DRESSINGS) ×3 IMPLANT
CHLORAPREP W/TINT 26ML (MISCELLANEOUS) ×3 IMPLANT
CLAMP CORD UMBIL (MISCELLANEOUS) IMPLANT
CLOSURE STERI STRIP 1/2 X4 (GAUZE/BANDAGES/DRESSINGS) ×3 IMPLANT
CLOTH BEACON ORANGE TIMEOUT ST (SAFETY) ×3 IMPLANT
DERMABOND ADVANCED (GAUZE/BANDAGES/DRESSINGS)
DERMABOND ADVANCED .7 DNX12 (GAUZE/BANDAGES/DRESSINGS) IMPLANT
DRSG OPSITE POSTOP 4X10 (GAUZE/BANDAGES/DRESSINGS) ×3 IMPLANT
ELECT REM PT RETURN 9FT ADLT (ELECTROSURGICAL) ×3
ELECTRODE REM PT RTRN 9FT ADLT (ELECTROSURGICAL) ×1 IMPLANT
EXTRACTOR VACUUM KIWI (MISCELLANEOUS) IMPLANT
GLOVE BIOGEL PI IND STRL 6.5 (GLOVE) ×1 IMPLANT
GLOVE BIOGEL PI IND STRL 7.0 (GLOVE) ×2 IMPLANT
GLOVE BIOGEL PI INDICATOR 6.5 (GLOVE) ×2
GLOVE BIOGEL PI INDICATOR 7.0 (GLOVE) ×4
GLOVE ECLIPSE 6.5 STRL STRAW (GLOVE) ×3 IMPLANT
GOWN STRL REUS W/TWL LRG LVL3 (GOWN DISPOSABLE) ×9 IMPLANT
KIT ABG SYR 3ML LUER SLIP (SYRINGE) IMPLANT
NEEDLE HYPO 25X5/8 SAFETYGLIDE (NEEDLE) IMPLANT
NS IRRIG 1000ML POUR BTL (IV SOLUTION) ×3 IMPLANT
PACK C SECTION WH (CUSTOM PROCEDURE TRAY) ×3 IMPLANT
PAD ABD 7.5X8 STRL (GAUZE/BANDAGES/DRESSINGS) ×3 IMPLANT
PAD OB MATERNITY 4.3X12.25 (PERSONAL CARE ITEMS) ×3 IMPLANT
PENCIL SMOKE EVAC W/HOLSTER (ELECTROSURGICAL) ×3 IMPLANT
RTRCTR C-SECT PINK 25CM LRG (MISCELLANEOUS) ×3 IMPLANT
STRIP CLOSURE SKIN 1/2X4 (GAUZE/BANDAGES/DRESSINGS) ×2 IMPLANT
SUT PLAIN 0 NONE (SUTURE) IMPLANT
SUT PLAIN 2 0 XLH (SUTURE) IMPLANT
SUT VIC AB 0 CT1 27 (SUTURE) ×4
SUT VIC AB 0 CT1 27XBRD ANBCTR (SUTURE) ×2 IMPLANT
SUT VIC AB 0 CTX 36 (SUTURE) ×6
SUT VIC AB 0 CTX36XBRD ANBCTRL (SUTURE) ×3 IMPLANT
SUT VIC AB 2-0 CT1 27 (SUTURE) ×2
SUT VIC AB 2-0 CT1 TAPERPNT 27 (SUTURE) ×1 IMPLANT
SUT VIC AB 4-0 KS 27 (SUTURE) ×3 IMPLANT
TOWEL OR 17X24 6PK STRL BLUE (TOWEL DISPOSABLE) ×3 IMPLANT
TRAY FOLEY BAG SILVER LF 14FR (SET/KITS/TRAYS/PACK) IMPLANT

## 2017-02-17 NOTE — Op Note (Signed)
PreOp Diagnosis: - Intrauterine pregnancy @ 740w3d - Arrest of dilation -Chorioamnionitis -Preeclampsia without severe features  PostOp Diagnosis: same Procedure: Primary C-section Surgeon: Dr. Myna HidalgoJennifer Margy Sumler Assistant: Gerrit HeckJessica Emly, CNM Anesthesia: epidural Complications: none EBL: 554cc UOP: 100cc Fluids: 500cc   Findings: Female infant from vertex presentation, normal uterus, tubes and ovaries bilaterally.  PROCEDURE:  Informed consent was obtained from the patient with risks, benefits, complications, treatment options, and expected outcomes discussed with the patient.  The patient concurred with the proposed plan, giving informed consent with form signed.   The patient was taken to Operating Room, and identified with the procedure verified as C-Section Delivery with Time Out. With induction of anesthesia, the patient was prepped and draped in the usual sterile fashion. A Pfannenstiel incision was made and carried down through the subcutaneous tissue to the fascia. The fascia was incised in the midline and extended transversely. The superior aspect of the fascial incision was grasped with Kochers elevated and the underlying muscle dissected off. The inferior aspect of the facial incision was in similar fashion, grasped elevated and rectus muscles dissected off. The peritoneum was identified and entered. Peritoneal incision was extended longitudinally. The utero-vesical peritoneal reflection was identified and incised transversely with the [facility] scissors, the incision extended laterally, the bladder flap created digitally. A low transverse uterine incision was made and the infants head delivered atraumatically. After the umbilical cord was clamped and cut cord blood was obtained for evaluation.   The placenta was removed intact and appeared normal. The uterine outline, tubes and ovaries appeared normal. The uterine incision was closed with running locked sutures of 0 Vicryl and a second layer  of the same stitch was used in an imbricating fashion.  Excellent hemostasis was obtained.  The pericolic gutters were then cleared of all clots and debris. Interceed was placed over the incision.  The peritoneum was closed in a running fashion.  The fascia was then reapproximated with running sutures of 0 Vicryl. The subcutaneous tissue was reapproximated with 2-0 plain gut suture.  The skin was closed with 4-0 vicryl in a subcuticular fashion.  Instrument, sponge, and needle counts were correct prior the abdominal closure and at the conclusion of the case. The patient was taken to recovery in stable condition.  Myna HidalgoJennifer Kion Huntsberry, DO [phone] (cell) [phone] (office)

## 2017-02-17 NOTE — Progress Notes (Signed)
At bedside to discuss management.  Based on last SVE- pt has been 6cm for several hours with no further cervical dilation.  Additionally, she has developed both chorioamnionitis and preeclampisa.  Discussed risk/benefit of C-section and reviewed alternative option.  Pt desires to proceed with primary C-section due to arrest of dilation.  Risk benefits and alternatives of cesarean section were discussed with the patient including but not limited to infection, bleeding, damage to bowel , bladder and baby with the need for further surgery. Pt voiced understanding and desires to proceed.   Myna HidalgoJennifer Conny Situ, DO [phone] (cell) [phone] (office)

## 2017-02-17 NOTE — Progress Notes (Signed)
Lucianne MussLyna Pond is a 21 y.o. G1P0 at 1027w3d  Subjective: Called by RN earlier due to variables and informed of a prolonged decel earlier in the morning.  Also requesting orders to give pt something for pain.  Objective: BP 120/63   Pulse (!) 107   Temp 98.3 F (36.8 C) (Oral)   Resp 16   Ht 5\' 3"  (1.6 m)   Wt 80.7 kg (178 lb)   LMP 05/17/2016   SpO2 100%   BMI 31.53 kg/m  No intake/output data recorded. Total I/O In: -  Out: 300 [Urine:300]  FHT:  Decreased variability s/p IV fentanyl UC:   Regular q 2-4 min SVE:   Dilation: 3 Effacement (%): 90 Station: -2 Exam by:: Dr. Su Hiltoberts  Labs: Lab Results  Component Value Date   WBC 10.5 02/17/2017   HGB 11.2 (L) 02/17/2017   HCT 34.8 (L) 02/17/2017   MCV 81.5 02/17/2017   PLT 304 02/17/2017    Assessment / Plan: Spontaneous labor, progressing normally  Labor: cont expectant mgmt.  Attempt made to place IUPC but pt too uncomfortable without epidural. Preeclampsia:  no signs or symptoms of toxicity Fetal Wellbeing:  Category II Pain Control:  IV pain meds I/D:  GBS neg Anticipated MOD:  NSVD  Purcell Nailsngela Y Royale Lennartz 02/17/2017, 3:28 PM

## 2017-02-17 NOTE — Transfer of Care (Signed)
Immediate Anesthesia Transfer of Care Note  Patient: Suzanne Flores  Procedure(s) Performed: CESAREAN SECTION (N/A )  Patient Location: PACU  Anesthesia Type:Epidural  Level of Consciousness: awake, alert  and oriented  Airway & Oxygen Therapy: Patient Spontanous Breathing  Post-op Assessment: Report given to RN and Post -op Vital signs reviewed and stable  Post vital signs: Reviewed and stable  Last Vitals:  Vitals:   02/17/17 2200 02/17/17 2207  BP: 136/87 (!) 149/87  Pulse: 100 (!) 105  Resp:    Temp:    SpO2:      Last Pain:  Vitals:   02/17/17 2123  TempSrc: Oral  PainSc:       Patients Stated Pain Goal: 5 (02/17/17 0612)  Complications: No apparent anesthesia complications

## 2017-02-17 NOTE — H&P (Signed)
Suzanne Flores is a 21 y.o. female G1P0 @ 9667w3d  presenting for spontaneous labor and SROM on 02/16/17 @ 2330. P OB History    Gravida Para Term Preterm AB Living   1             SAB TAB Ectopic Multiple Live Births                 Past Medical History:  Diagnosis Date  . Anxiety    Past Surgical History:  Procedure Laterality Date  . WISDOM TOOTH EXTRACTION     Family History: family history is not on file. Social History:  reports that  has never smoked. she has never used smokeless tobacco. She reports that she does not drink alcohol or use drugs.     Maternal Diabetes: No Genetic Screening: Normal Maternal Ultrasounds/Referrals: Normal Fetal Ultrasounds or other Referrals:  None Maternal Substance Abuse:  No Significant Maternal Medications:  None Significant Maternal Lab Results:  None Other Comments:  None  Review of Systems  Gastrointestinal: Positive for abdominal pain.  All other systems reviewed and are negative.  Maternal Medical History:  Reason for admission: Rupture of membranes and contractions.   Contractions: Onset was 6-12 hours ago.   Frequency: regular.   Duration is approximately 5 minutes.   Perceived severity is mild.    Fetal activity: Perceived fetal activity is normal.   Last perceived fetal movement was within the past hour.    Prenatal complications: no prenatal complications   Dilation: 1.5 Effacement (%): 50 Exam by:: T.Lytle RN Blood pressure 137/84, pulse 92, temperature (!) 97.5 F (36.4 C), temperature source Oral, resp. rate 18, height 5\' 3"  (1.6 m), weight 178 lb 1.9 oz (80.8 kg), last menstrual period 05/17/2016. Maternal Exam:  Uterine Assessment: Contraction strength is mild.  Contraction frequency is regular.   Abdomen: Patient reports no abdominal tenderness. Estimated fetal weight is 7 lbs.   Fetal presentation: vertex     Fetal Exam Fetal Monitor Review: Mode: ultrasound.   Pattern: accelerations present and no  decelerations.    Fetal State Assessment: Category I - tracings are normal.     Physical Exam  Nursing note and vitals reviewed. Constitutional: She is oriented to person, place, and time. She appears well-developed and well-nourished.  HENT:  Head: Normocephalic and atraumatic.  Neck: Normal range of motion.  Cardiovascular: Normal rate.  Respiratory: Effort normal. No respiratory distress.  GI: Soft. There is no tenderness.  Musculoskeletal: Normal range of motion.  Neurological: She is alert and oriented to person, place, and time.  Skin: Skin is warm and dry.  Psychiatric: She has a normal mood and affect. Her behavior is normal. Thought content normal.    Prenatal labs: ABO, Rh:  O+ Antibody:  negative Rubella:  Immune RPR:   NR HBsAg:   Negative HIV:   NR GBS:   Negative  Assessment/Plan: Early Labor @ 39+3 SROM- large amount clear fluid Admit L/D Routine orders GBS negative Epidural if desired Anticipate Vaginal Delivery  Adal Sereno A Brayleigh Rybacki CNM 02/17/2017, 5:07 AM

## 2017-02-17 NOTE — Progress Notes (Addendum)
Lucianne MussLyna Costilla MRN: [id number]  Subjective: -Nurse call and reports patient febrile.  Strip reviewed.  In room to assess.  Patient denies rectal pressure and reports constant back pain and chills.    Objective: BP 130/84   Pulse (!) 110   Temp (!) 101.3 F (38.5 C) (Axillary)   Resp 19   Ht 5\' 3"  (1.6 m)   Wt 80.7 kg (178 lb)   LMP 05/17/2016   SpO2 100%   BMI 31.53 kg/m  I/O last 3 completed shifts: In: -  Out: 300 [Urine:300] No intake/output data recorded.  Fetal Monitoring: FHT: 150 bpm, Mod Var, -Decels, +Accels UC: Q1-813min, palpates strong, MVUS 80mmHg    Vaginal Exam: SVE:   Dilation: Lip/rim Effacement (%): 100 Station: 0 Exam by:: Mary SwazilandJordan Johnson, RN  Membranes:SROM x 21 hrs Internal Monitors: IUPC in place and functional  Augmentation/Induction: Pitocin:None Cytotec: None  Assessment:  IUP at 39.3wks Cat I FT  Febrile Elevated BP  Plan: -Start Unasyn 3g Q 6hr  -Give tylenol 650 mg liquid orally -PC Ratio for initial PreEclampsia evaluation -Will recheck cervix around 2130 -Discussed findings and POC with patient  -No q/c -Dr. Katharine LookJ. Ozan updated and agrees with plan, no new orders given -Continue other mgmt as ordered   Valma CavaJessica L Jimia Gentles,MSN, CNM 02/17/2017, 8:49 PM  Addendum (2115)  Nurse call reports patient with c/o rectal pressure.  In room to assess.  Patient reports constant rectal pressure that increases with contractions.  Dilation: 6 Effacement (%): 100 Cervical Position: Middle Station: 0 Presentation: Vertex Exam by:: Gerrit HeckJessica Fallon Howerter, CNM  Results for orders placed or performed during the hospital encounter of 02/17/17 (from the past 24 hour(s))  POCT fern test     Status: Abnormal   Collection Time: 02/17/17  4:48 AM  Result Value Ref Range   POCT Fern Test Positive = ruptured amniotic membanes   CBC     Status: Abnormal   Collection Time: 02/17/17  5:34 AM  Result Value Ref Range   WBC 10.5 4.0 - 10.5 K/uL   RBC 4.27 3.87 -  5.11 MIL/uL   Hemoglobin 11.2 (L) 12.0 - 15.0 g/dL   HCT 56.234.8 (L) 13.036.0 - 86.546.0 %   MCV 81.5 78.0 - 100.0 fL   MCH 26.2 26.0 - 34.0 pg   MCHC 32.2 30.0 - 36.0 g/dL   RDW 78.414.5 69.611.5 - 29.515.5 %   Platelets 304 150 - 400 K/uL  Type and screen [facility]     Status: None   Collection Time: 02/17/17  5:34 AM  Result Value Ref Range   ABO/RH(D) O POS    Antibody Screen NEG    Sample Expiration 02/20/2017   RPR     Status: None   Collection Time: 02/17/17  5:34 AM  Result Value Ref Range   RPR Ser Ql Non Reactive Non Reactive  ABO/Rh     Status: None   Collection Time: 02/17/17  5:34 AM  Result Value Ref Range   ABO/RH(D) O POS   Protein / creatinine ratio, urine     Status: Abnormal   Collection Time: 02/17/17  8:28 PM  Result Value Ref Range   Creatinine, Urine 71.00 mg/dL   Total Protein, Urine 31 mg/dL   Protein Creatinine Ratio 0.44 (H) 0.00 - 0.15 mg/mg[Cre]    IUP at 39.3wks Cat I FT PreEclampsia Arrest of Dilation Chorioamnionitis   -PC Ratio returns elevated  -Dr. Katharine LookJ. Ozan consulted and after reviewing patient status,  labs, and dilation recommendation to proceed with c/s. -In room to discuss recommendation with patient who agrees -R/B reviewed including, but not limited to, infection, bleeding, pain, damage to organs or fetus resulting in need for additional surgery.  Patient understands and accepts these risks and wishes to proceed with c/s. Q/C addressed Team notified Dr. Katharine LookJ. Ozan en route  Cherre RobinsJessica L Emanual Lamountain MSN, CNM 02/17/2017 9:57 PM

## 2017-02-17 NOTE — Anesthesia Preprocedure Evaluation (Signed)
Anesthesia Evaluation  Patient identified by MRN, date of birth, ID band Patient awake    Reviewed: Allergy & Precautions, H&P , NPO status , Patient's Chart, lab work & pertinent test results  Airway Mallampati: II   Neck ROM: full    Dental   Pulmonary neg pulmonary ROS,    breath sounds clear to auscultation       Cardiovascular negative cardio ROS   Rhythm:regular Rate:Normal     Neuro/Psych Anxiety    GI/Hepatic   Endo/Other    Renal/GU      Musculoskeletal   Abdominal   Peds  Hematology   Anesthesia Other Findings   Reproductive/Obstetrics (+) Pregnancy                             Anesthesia Physical Anesthesia Plan  ASA: I  Anesthesia Plan: Epidural   Post-op Pain Management:    Induction: Intravenous  PONV Risk Score and Plan: 2 and Treatment may vary due to age or medical condition  Airway Management Planned: Natural Airway  Additional Equipment:   Intra-op Plan:   Post-operative Plan:   Informed Consent: I have reviewed the patients History and Physical, chart, labs and discussed the procedure including the risks, benefits and alternatives for the proposed anesthesia with the patient or authorized representative who has indicated his/her understanding and acceptance.     Plan Discussed with: Anesthesiologist  Anesthesia Plan Comments:         Anesthesia Quick Evaluation

## 2017-02-17 NOTE — MAU Note (Signed)
Started leaking fluid at 2330; cntrx 15 mins apart; now 5-6 mins apart. Denies bleeding. + FM.

## 2017-02-17 NOTE — Progress Notes (Signed)
Suzanne Flores is a 21 y.o. G1P0 at 9022w3d   Subjective: Comfortable s/p epidural  Objective: BP 120/63   Pulse (!) 107   Temp 98.3 F (36.8 C) (Oral)   Resp 16   Ht 5\' 3"  (1.6 m)   Wt 80.7 kg (178 lb)   LMP 05/17/2016   SpO2 100%   BMI 31.53 kg/m  No intake/output data recorded. Total I/O In: -  Out: 300 [Urine:300]  FHT:  Cat 1 UC:   regular, every 2-5 minutes SVE:   Dilation: 4.5 Effacement (%): 90 Station: -2 Exam by:: Dr. Su Hiltoberts  Labs: Lab Results  Component Value Date   WBC 10.5 02/17/2017   HGB 11.2 (L) 02/17/2017   HCT 34.8 (L) 02/17/2017   MCV 81.5 02/17/2017   PLT 304 02/17/2017    Assessment / Plan: spontaneous labor with contractions spacing some after epidural  Labor: IUPC placed but pelvis is tight.  IUPC not in deep but working.  Contractions are inadequate, will augment with pitocin. Preeclampsia:  no signs or symptoms of toxicity Fetal Wellbeing:  Category I Pain Control:  Epidural I/D:  GBS neg Anticipated MOD:  NSVD  Suzanne Flores 02/17/2017, 3:33 PM

## 2017-02-17 NOTE — Anesthesia Pain Management Evaluation Note (Signed)
  CRNA Pain Management Visit Note  Patient: Suzanne Flores, 21 y.o., female  "Hello I am a [facility]. We have an anesthesia team available at all times to provide care throughout the hospital, including epidural management and anesthesia for C-section. I don't know your plan for the delivery whether it a natural birth, water birth, IV sedation, nitrous supplementation, doula or epidural, but we want to meet your pain goals."   1.Was your pain managed to your expectations on prior hospitalizations?  No prior hospitalization.  2.What is your expectation for pain management during this hospitalization?     Nitrous Oxide  3.How can we help you reach that goal? Patient plans on trying nitrous first, then may consider epidural.2  Record the patient's initial score and the patient's pain goal.   Pain: 10 but does not want anything for pain now. Told patient to call RN if she desires something for pain.  Pain Goal: 10 currently [facility] wants you to be able to say your pain was always managed very well.  Ilee Randleman 02/17/2017

## 2017-02-17 NOTE — Anesthesia Procedure Notes (Signed)
Epidural Patient location during procedure: OB Start time: 02/17/2017 1:10 PM End time: 02/17/2017 1:25 PM  Staffing Anesthesiologist: Achille RichHodierne, Evona Westra, MD Performed: anesthesiologist   Preanesthetic Checklist Completed: patient identified, site marked, pre-op evaluation, timeout performed, IV checked, risks and benefits discussed and monitors and equipment checked  Epidural Patient position: sitting Prep: DuraPrep Patient monitoring: heart rate, cardiac monitor, continuous pulse ox and blood pressure Approach: midline Location: L2-L3 Injection technique: LOR saline  Needle:  Needle type: Tuohy  Needle gauge: 17 G Needle length: 9 cm Needle insertion depth: 6 cm Catheter type: closed end flexible Catheter size: 19 Gauge Catheter at skin depth: 12 cm Test dose: negative and Other  Assessment Events: blood not aspirated, injection not painful, no injection resistance and negative IV test  Additional Notes Informed consent obtained prior to proceeding including risk of failure, 1% risk of PDPH, risk of minor discomfort and bruising.  Discussed rare but serious complications including epidural abscess, permanent nerve injury, epidural hematoma.  Discussed alternatives to epidural analgesia and patient desires to proceed.  Timeout performed pre-procedure verifying patient name, procedure, and platelet count.  Patient tolerated procedure well. Reason for block:procedure for pain

## 2017-02-18 ENCOUNTER — Encounter (HOSPITAL_COMMUNITY): Payer: Self-pay

## 2017-02-18 LAB — CBC
HCT: 30.1 % — ABNORMAL LOW (ref 36.0–46.0)
HEMATOCRIT: 30.5 % — AB (ref 36.0–46.0)
HEMOGLOBIN: 10 g/dL — AB (ref 12.0–15.0)
HEMOGLOBIN: 10.1 g/dL — AB (ref 12.0–15.0)
MCH: 26.4 pg (ref 26.0–34.0)
MCH: 27 pg (ref 26.0–34.0)
MCHC: 33.1 g/dL (ref 30.0–36.0)
MCHC: 33.2 g/dL (ref 30.0–36.0)
MCV: 79.8 fL (ref 78.0–100.0)
MCV: 81.1 fL (ref 78.0–100.0)
PLATELETS: 268 10*3/uL (ref 150–400)
Platelets: 278 10*3/uL (ref 150–400)
RBC: 3.71 MIL/uL — AB (ref 3.87–5.11)
RBC: 3.82 MIL/uL — AB (ref 3.87–5.11)
RDW: 14.7 % (ref 11.5–15.5)
RDW: 14.8 % (ref 11.5–15.5)
WBC: 22.2 10*3/uL — ABNORMAL HIGH (ref 4.0–10.5)
WBC: 23 10*3/uL — ABNORMAL HIGH (ref 4.0–10.5)

## 2017-02-18 LAB — COMPREHENSIVE METABOLIC PANEL
ALK PHOS: 259 U/L — AB (ref 38–126)
ALT: 11 U/L — AB (ref 14–54)
ANION GAP: 9 (ref 5–15)
AST: 27 U/L (ref 15–41)
Albumin: 2.4 g/dL — ABNORMAL LOW (ref 3.5–5.0)
BUN: 9 mg/dL (ref 6–20)
CALCIUM: 8.2 mg/dL — AB (ref 8.9–10.3)
CO2: 21 mmol/L — ABNORMAL LOW (ref 22–32)
CREATININE: 0.53 mg/dL (ref 0.44–1.00)
Chloride: 103 mmol/L (ref 101–111)
Glucose, Bld: 98 mg/dL (ref 65–99)
Potassium: 4.3 mmol/L (ref 3.5–5.1)
Sodium: 133 mmol/L — ABNORMAL LOW (ref 135–145)
TOTAL PROTEIN: 5.4 g/dL — AB (ref 6.5–8.1)
Total Bilirubin: 0.8 mg/dL (ref 0.3–1.2)

## 2017-02-18 MED ORDER — ACETAMINOPHEN 325 MG PO TABS
650.0000 mg | ORAL_TABLET | ORAL | Status: DC | PRN
  Administered 2017-02-19: 325 mg via ORAL
  Filled 2017-02-18: qty 2

## 2017-02-18 MED ORDER — LACTATED RINGERS IV SOLN
INTRAVENOUS | Status: DC
  Administered 2017-02-18 (×2): via INTRAVENOUS

## 2017-02-18 MED ORDER — OXYCODONE HCL 5 MG PO TABS: 10.0000 mg | ORAL_TABLET | ORAL | Status: DC | PRN

## 2017-02-18 MED ORDER — MENTHOL 3 MG MT LOZG
1.0000 | LOZENGE | OROMUCOSAL | Status: DC | PRN
  Filled 2017-02-18: qty 9

## 2017-02-18 MED ORDER — SENNOSIDES-DOCUSATE SODIUM 8.6-50 MG PO TABS
2.0000 | ORAL_TABLET | ORAL | Status: DC
  Administered 2017-02-18: 2 via ORAL
  Filled 2017-02-18 (×4): qty 2

## 2017-02-18 MED ORDER — SIMETHICONE 80 MG PO CHEW
80.0000 mg | CHEWABLE_TABLET | ORAL | Status: DC
  Administered 2017-02-18: 80 mg via ORAL
  Filled 2017-02-18 (×4): qty 1

## 2017-02-18 MED ORDER — WITCH HAZEL-GLYCERIN EX PADS: 1.0000 "application " | MEDICATED_PAD | CUTANEOUS | Status: DC | PRN

## 2017-02-18 MED ORDER — MAGNESIUM SULFATE 40 G IN LACTATED RINGERS - SIMPLE
2.0000 g/h | INTRAVENOUS | Status: AC
  Administered 2017-02-18 (×2): 2 g/h via INTRAVENOUS
  Filled 2017-02-18 (×2): qty 40

## 2017-02-18 MED ORDER — SIMETHICONE 80 MG PO CHEW
80.0000 mg | CHEWABLE_TABLET | Freq: Three times a day (TID) | ORAL | Status: DC
  Administered 2017-02-18 – 2017-02-20 (×7): 80 mg via ORAL
  Filled 2017-02-18 (×10): qty 1

## 2017-02-18 MED ORDER — PRENATAL MULTIVITAMIN CH
1.0000 | ORAL_TABLET | Freq: Every day | ORAL | Status: DC
  Administered 2017-02-18 – 2017-02-20 (×3): 1 via ORAL
  Filled 2017-02-18 (×4): qty 1

## 2017-02-18 MED ORDER — LABETALOL HCL 5 MG/ML IV SOLN: 20.0000 mg | INTRAVENOUS | Status: DC | PRN

## 2017-02-18 MED ORDER — OXYTOCIN 40 UNITS IN LACTATED RINGERS INFUSION - SIMPLE MED: 2.5000 [IU]/h | INTRAVENOUS | Status: AC

## 2017-02-18 MED ORDER — ZOLPIDEM TARTRATE 5 MG PO TABS: 5.0000 mg | ORAL_TABLET | Freq: Every evening | ORAL | Status: DC | PRN

## 2017-02-18 MED ORDER — SODIUM CHLORIDE 0.9 % IV SOLN
3.0000 g | Freq: Four times a day (QID) | INTRAVENOUS | Status: AC
  Administered 2017-02-18: 3 g via INTRAVENOUS
  Filled 2017-02-18: qty 3

## 2017-02-18 MED ORDER — DIPHENHYDRAMINE HCL 25 MG PO CAPS
25.0000 mg | ORAL_CAPSULE | Freq: Four times a day (QID) | ORAL | Status: DC | PRN
  Filled 2017-02-18: qty 1

## 2017-02-18 MED ORDER — SIMETHICONE 80 MG PO CHEW
80.0000 mg | CHEWABLE_TABLET | ORAL | Status: DC | PRN
  Filled 2017-02-18: qty 1

## 2017-02-18 MED ORDER — MAGNESIUM SULFATE BOLUS VIA INFUSION
4.0000 g | Freq: Once | INTRAVENOUS | Status: AC
  Administered 2017-02-18: 4 g via INTRAVENOUS
  Filled 2017-02-18: qty 500

## 2017-02-18 MED ORDER — OXYCODONE HCL 5 MG PO TABS
5.0000 mg | ORAL_TABLET | ORAL | Status: DC | PRN
  Administered 2017-02-19: 5 mg via ORAL
  Filled 2017-02-18: qty 1

## 2017-02-18 MED ORDER — DIBUCAINE 1 % RE OINT
1.0000 "application " | TOPICAL_OINTMENT | RECTAL | Status: DC | PRN
  Filled 2017-02-18: qty 28

## 2017-02-18 MED ORDER — HYDRALAZINE HCL 20 MG/ML IJ SOLN: 10.0000 mg | Freq: Once | INTRAMUSCULAR | Status: DC | PRN

## 2017-02-18 MED ORDER — IBUPROFEN 600 MG PO TABS
600.0000 mg | ORAL_TABLET | Freq: Four times a day (QID) | ORAL | Status: DC
  Administered 2017-02-18 – 2017-02-20 (×9): 600 mg via ORAL
  Filled 2017-02-18 (×10): qty 1

## 2017-02-18 MED ORDER — TETANUS-DIPHTH-ACELL PERTUSSIS 5-2.5-18.5 LF-MCG/0.5 IM SUSP
0.5000 mL | Freq: Once | INTRAMUSCULAR | Status: DC
  Filled 2017-02-18: qty 0.5

## 2017-02-18 MED ORDER — COCONUT OIL OIL
1.0000 "application " | TOPICAL_OIL | Status: DC | PRN
  Administered 2017-02-18: 1 via TOPICAL
  Filled 2017-02-18 (×2): qty 120

## 2017-02-18 NOTE — Addendum Note (Signed)
Addendum  created 02/18/17 0819 by Angela AdamWrinkle, Sacheen Arrasmith G, CRNA   Charge Capture section accepted, Sign clinical note

## 2017-02-18 NOTE — Anesthesia Postprocedure Evaluation (Signed)
Anesthesia Post Note  Patient: Lucianne MussLyna Doerner  Procedure(s) Performed: CESAREAN SECTION (N/A )     Patient location during evaluation: PACU Anesthesia Type: Epidural Level of consciousness: awake and alert Pain management: pain level controlled Vital Signs Assessment: post-procedure vital signs reviewed and stable Respiratory status: spontaneous breathing, nonlabored ventilation and respiratory function stable Cardiovascular status: stable Postop Assessment: no headache, no backache and epidural receding Anesthetic complications: no    Last Vitals:  Vitals:   02/18/17 0400 02/18/17 0500  BP: (!) 122/57 (!) 119/91  Pulse: (!) 113 (!) 111  Resp: 16 18  Temp: 36.8 C 37.6 C  SpO2: 97% 95%    Last Pain:  Vitals:   02/18/17 0500  TempSrc: Oral  PainSc:    Pain Goal: Patients Stated Pain Goal: 5 (02/18/17 0055)               Beryle Lathehomas E Whitney Hillegass

## 2017-02-18 NOTE — Progress Notes (Signed)
Suzanne Flores [id number] Postpartum Day 1 S/P Primary Cesarean Section due to Arrest of Dilation in conjunction with PreEclampsia and Chorioamnionitis  Subjective: Patient up ad lib, denies syncope or dizziness. Reports consuming regular diet without issues and denies N/V. Patient reports no bowel movement and is not passing flatus.  Denies issues with urination and reports bleeding is "okay."  Patient is breastfeeding and reports going well.  Desires condoms for postpartum contraception.  Pain is being appropriately managed with use of ibuprofen.   Objective: Temp:  [97.4 F (36.3 C)-101.3 F (38.5 C)] 99.6 F (37.6 C) (12/01 0500) Pulse Rate:  [77-154] 98 (12/01 0600) Resp:  [16-25] 16 (12/01 0600) BP: (110-153)/(56-123) 124/74 (12/01 0600) SpO2:  [95 %-100 %] 95 % (12/01 0600)  Recent Labs    02/17/17 0534 02/18/17 0005 02/18/17 0504  HGB 11.2* 10.1* 10.0*  HCT 34.8* 30.5* 30.1*  WBC 10.5 23.0* 22.2*    Physical Exam:  General: alert, cooperative and no distress Mood/Affect: Appropriate, Congruent Lungs: clear to auscultation, no wheezes, rales or rhonchi, symmetric air entry.  Heart: normal rate and regular rhythm. Breast: breasts appear normal, no suspicious masses, no skin or nipple changes or axillary nodes. Abdomen:  + bowel sounds, Soft, Appropriately Tender Incision: Drainage marked on Honeycomb dressing  Uterine Fundus: firm Lochia: appropriate Skin: Warm, Dry. DVT Evaluation: No cords or calf tenderness. Calf/Ankle edema is present. JP drain:   None  Assessment Post Operative Day 1 S/P Primary C/S Normal Involution BreastFeeding Hemodynamically Stable PreEclampsia  Plan: -MgSO4 Infusion to be discontinued this evening -Advancement of activity as appropriate -Removal of foley catheter -Ambulation in room as tolerated -Regular diet as tolerated -No q/c -Continue other mgmt as ordered -Dr. Katharine LookJ. Ozan updated on patient status  Cherre RobinsJessica L Jamarria Real MSN,  CNM 02/18/2017, 6:59 AM

## 2017-02-18 NOTE — Lactation Note (Signed)
This note was copied from a baby's chart. Lactation Consultation Note  Patient Name: Suzanne Flores ZOXWR'UToday's Date: 02/18/2017 Reason for consult: Initial assessment;Primapara Infant is 8611 hours old & seen by Lactation for Initial Assessment. Infant was born at 5667w3d and weighed 6 lbs 15.5 oz at birth. [facility] Mom reports BF is going well but also stated that BF is slightly painful. Mom explained she feels a tug and biting/ pinching. Discussed importance of a good latch to prevent pain and that some discomfort can be normal in the first few weeks of BF. Encouraged mom to express some breastmilk after BF and rub into her nipples; also discussed how she could use coconut oil, which her RN can bring to her. Mom reported she would like some coconut oil so LC told her RN. Mom stated she did not know how to hand express so LC talked mom through it and drops were seen from both breasts right away. Mom's left nipple had a raw spot & blood came out with colostrum when she hand expressed -mom stated baby bit her. Encouraged mom to ask for her RN at next feeding to assist with latch to help prevent further pain/ damage. Mom rubbed the EBM into her nipples.  Provided LC Booklet, BF Resources, and feeding log; mom made aware of O/P services, breastfeeding support groups, community resources, and our phone # for post-discharge questions.  Mom encouraged to feed baby 8-12 times/24 hours and with feeding cues. Encouraged skin-to-skin, discussed I/O, milk volume, importance of hand expression. [facility] because she thought she could not since other people in her house receive WIC; mom is on Medicaid. [facility] so encouraged mom to call Monday to set up appt. Mom reports she will be going back to school in January but no work.  Provided mom with hand pump for occasional use- showed her how to use & clean  it. Mom reports no questions. Encouraged mom to ask for help as needed and reminded mom to call her RN at the next feeding to assist with latch.  Per RN note at 6:30am this morning, mom refused using a Nipple Shield. Mom did not mention the NS and neither did LC. Without seeing a latch, LC did not want to encourage or discourage the use of it.   Reported all of this to their RN.   Maternal Data Has patient been taught Hand Expression?: Yes  Feeding  LATCH Score  Interventions Interventions: Breast feeding basics reviewed;Hand express;Hand pump;Coconut oil;Expressed milk  Lactation Tools Discussed/Used WIC Program: No(eligible, plans to call Monday) Pump Review: Setup, frequency, and cleaning   Consult Status Consult Status: Follow-up Date: 02/19/17 Follow-up type: In-patient    Oneal GroutLaura C Carole Deere 02/18/2017, 10:18 AM

## 2017-02-18 NOTE — Progress Notes (Signed)
Tracing reviewed at 5:40p and variables noted with developing hyperstimulation.  Mary SwazilandJordan, RN called and ask to half the pitocin (6mu to 3mu).   Patient and tracing signed out to Dr. Charlotta Newtonzan at 6pm at which time tracing had improved.  The nurse report she had checked the pt not long before and she was 7cm.

## 2017-02-18 NOTE — Anesthesia Postprocedure Evaluation (Signed)
Anesthesia Post Note  Patient: Lucianne MussLyna Hedtke  Procedure(s) Performed: CESAREAN SECTION (N/A )     Patient location during evaluation: Mother Baby Anesthesia Type: Epidural Level of consciousness: awake and alert, oriented and patient cooperative Pain management: pain level controlled Vital Signs Assessment: post-procedure vital signs reviewed and stable Respiratory status: spontaneous breathing Cardiovascular status: stable Postop Assessment: no headache, epidural receding, patient able to bend at knees and no signs of nausea or vomiting Anesthetic complications: no Comments: Pain score 2.    Last Vitals:  Vitals:   02/18/17 0700 02/18/17 0800  BP: 135/64 134/64  Pulse: (!) 103 100  Resp: 18 18  Temp: 37.2 C 36.8 C  SpO2: 96% 100%    Last Pain:  Vitals:   02/18/17 0800  TempSrc: Oral  PainSc:    Pain Goal: Patients Stated Pain Goal: 5 (02/18/17 0055)               Merrilyn PumaWRINKLE,Hulen Mandler

## 2017-02-19 NOTE — Progress Notes (Signed)
Suzanne Flores [id number] Postpartum Day 2 S/P Primary Cesarean Section due to Arrest of Dilation in conjunction with PreEclampsia and Chorioamnionitis  Subjective: Patient up ad lib, denies syncope or dizziness. Reports consuming regular diet without issues and denies N/V. Patient reports no bowel movement, but is passing flatus.  Denies issues with urination and reports bleeding is "okay."  Patient is breastfeeding and reports going well.  Desires condoms for postpartum contraception.  Pain is being appropriately managed with use of ibuprofen.   Objective: Temp:  [97.8 F (36.6 C)-98.9 F (37.2 C)] 97.8 F (36.6 C) (12/02 0400) Pulse Rate:  [87-112] 87 (12/02 0400) Resp:  [16-18] 17 (12/02 0400) BP: (101-135)/(50-64) 108/55 (12/02 0400) SpO2:  [96 %-100 %] 100 % (12/02 0400)  Recent Labs    02/17/17 0534 02/18/17 0005 02/18/17 0504  HGB 11.2* 10.1* 10.0*  HCT 34.8* 30.5* 30.1*  WBC 10.5 23.0* 22.2*    Physical Exam:  General: alert, cooperative and no distress Mood/Affect: Appropriate, Congruent Lungs: clear to auscultation, no wheezes, rales or rhonchi, symmetric air entry.  Heart: normal rate and regular rhythm. Breast: breasts appear normal, no suspicious masses, no skin or nipple changes or axillary nodes. Abdomen:  + bowel sounds, Soft, Appropriately Tender Incision: Drainage marked on Honeycomb dressing  Uterine Fundus: firm/U-1 Lochia: appropriate Skin: Warm, Dry. DVT Evaluation:No cords or calf tenderness. Calf/Ankle edema is present. JP drain:   None  Assessment Post Operative Day 2 S/P Primary C/S Normal Involution BreastFeeding Hemodynamically Stable PreEclampsia  Plan: -Continue ambulation in halls as tolerated -Regular diet as tolerated -Obtain outside pediatrician prior to discharge -Plan for discharge tomorrow -Continue other mgmt as ordered -Dr. Katharine LookJ. Ozan updated on patient status  Cherre RobinsJessica L Valeen Borys MSN, CNM 02/19/2017, 6:32 AM

## 2017-02-19 NOTE — Lactation Note (Addendum)
This note was copied from a baby's chart. Lactation Consultation Note  Patient Name: Girl Lucianne MussLyna Poulton WUJWJ'XToday's Date: 02/19/2017 Reason for consult: Follow-up assessment  Mom stated that she sometimes uses a nipple shield to help infant latch (Mom said she uses it less than 50% of the time).  Mom was made aware that if she begins using the nipple shield more often, then she may need to pump a little bit. Hand pump is in the room.  Infant was observed latching with the nipple shield. Infant latched with ease when Mom has proper alignment at the breast. Consistent, frequent swallows were noted (verified by cervical auscultation). A compression stripe was noted on her L nipple, which Mom stated was from a previous latch. Mom stated that she was comfortable with infant's latch. Mom reported seeing colostrum in the nipple shield when infant released latch.  [facility]. She says she already has pump parts at home if [facility]   Pacifier at bedside. I encouraged Mom to try and delay pacifier until 342 weeks of age. Mom denies any questions.  Dishwashing liquid taken into room so that Mom can wash nipple shield after each use.  Lurline HareRichey, Nazaiah Navarrete Kahi Mohalaamilton 02/19/2017, 5:35 PM

## 2017-02-19 NOTE — Plan of Care (Signed)
Patient and husband bonding well with infant daughter.

## 2017-02-19 NOTE — Clinical Social Work Note (Signed)
MOB was referred for history of depression/anxiety. * Referral screened out by Clinical Social Worker because none of the following criteria appear to apply: ~ History of anxiety/depression during this pregnancy, or of post-partum depression. ~ Diagnosis of anxiety and/or depression within last 3 years OR * MOB's symptoms currently being treated with medication and/or therapy. Please contact the Clinical Social Worker if needs arise, or if MOB requests.  CSW identifies no further need for intervention and no barriers to discharge at this time.  Jesse Kordel Leavy, LCSW [phone]   

## 2017-02-20 DIAGNOSIS — O1490 Unspecified pre-eclampsia, unspecified trimester: Secondary | ICD-10-CM | POA: Diagnosis present

## 2017-02-20 DIAGNOSIS — O411290 Chorioamnionitis, unspecified trimester, not applicable or unspecified: Secondary | ICD-10-CM | POA: Diagnosis not present

## 2017-02-20 DIAGNOSIS — Z98891 History of uterine scar from previous surgery: Secondary | ICD-10-CM

## 2017-02-20 MED ORDER — IBUPROFEN 600 MG PO TABS: 600.0000 mg | ORAL_TABLET | Freq: Four times a day (QID) | ORAL | 1 refills | Status: DC | PRN

## 2017-02-20 NOTE — Progress Notes (Signed)
Discharge instructions were given to patient and she verbalized understanding of all instructions provided. Written copy of AVS given to patient.

## 2017-02-20 NOTE — Discharge Instructions (Signed)
Postpartum Care After Cesarean Delivery °The period of time right after you deliver your newborn is called the postpartum period. °What kind of medical care will I receive? °· You may continue to receive fluids and medicines through an IV tube inserted into one of your veins. °· You may have small, flexible tube (catheter) draining urine from your bladder into a bag outside of your body. The catheter will be removed as soon as possible. °· You may be given a squirt bottle to use when you go to the bathroom. You may use this until you are comfortable wiping as usual. To use the squirt bottle, follow these steps: °? Before you urinate, fill the squirt bottle with warm water. The water should be warm. Do not use hot water. °? After you urinate, while you are sitting on the toilet, use the squirt bottle to rinse the area around your urethra and vaginal opening. This rinses away any urine and blood. °? You may do this instead of wiping. As you start healing, you may use the squirt bottle before wiping yourself. Make sure to wipe gently. °? Fill the squirt bottle with clean water every time you use the bathroom. °· You will be given sanitary pads to wear. °· Your incision will be monitored to make sure it is healing properly. You will be told when it is safe for your stitches, staples, or skin adhesive tape to be removed. °What can I expect? °· You may not feel the need to urinate for several hours after delivery. °· You will have some soreness and pain in your abdomen. You may have a small amount of blood or clear fluid coming from your incision. °· If you are breastfeeding, you may have uterine contractions every time you breastfeed for up to several weeks postpartum. Uterine contractions help your uterus return to its normal size. °· It is normal to have vaginal bleeding (lochia) after delivery. The amount and appearance of lochia is often similar to a menstrual period in the first week after delivery. It will  gradually decrease over the next few weeks to a dry, yellow-brown discharge. For most women, lochia stops completely by 6-8 weeks after delivery. Vaginal bleeding can vary from woman to woman. °· Within the first few days after delivery, you may have breast engorgement. This is when your breasts feel heavy, full, and uncomfortable. Your breasts may also throb and feel hard, tightly stretched, warm, and tender. After this occurs, you may have milk leaking from your breasts. Your health care provider can help you relieve discomfort due to breast engorgement. Breast engorgement should go away within a few days. °· You may feel more sad or worried than normal due to hormonal changes after delivery. These feelings should not last more than a few days. If these feelings do not go away after several days, speak with your health care provider. °How should I care for myself? °· Tell your health care provider if you have pain or discomfort. °· Drink enough water to keep your urine clear or pale yellow. °· Wash your hands thoroughly with soap and water for at least 20 seconds after changing your sanitary pads or using the toilet, and before holding or feeding your baby. °· If you are not breastfeeding, avoid touching your breasts a lot. Doing this can make your breasts produce more milk. °· If you become weak or lightheaded, or you feel like you might faint, ask for help before: °? Getting out of bed. °? Showering. °·   Change your sanitary pads frequently. Watch for any changes in your flow, such as a sudden increase in volume, a change in color, or the passing of large blood clots. If you pass a blood clot from your vagina, save it to show to your health care provider. Do not flush blood clots down the toilet without having your health care provider look at them. °· Make sure that all your vaccinations are up to date. This can help protect you and your baby from getting certain diseases. You may need to have immunizations done  before you leave the hospital. °· If desired, talk with your health care provider about methods of family planning or birth control (contraception). °How can I start bonding with my baby? °Spending as much time as possible with your baby is very important. During this time, you and your baby can get to know each other and develop a bond. Having your baby stay with you in your room (rooming in) can give you time to get to know your baby. Rooming in can also help you become comfortable caring for your baby. Breastfeeding can also help you bond with your baby. °How can I plan for returning home with my baby? °· Make sure that you have a car seat installed in your vehicle. °? Your car seat should be checked by a certified car seat installer to make sure that it is installed safely. °? Make sure that your baby fits into the car seat safely. °· Ask your health care provider any questions you have about caring for yourself or your baby. Make sure that you are able to contact your health care provider with any questions after leaving the hospital. °This information is not intended to replace advice given to you by your health care provider. Make sure you discuss any questions you have with your health care provider. °Document Released: 11/30/2011 Document Revised: 08/10/2015 Document Reviewed: 02/09/2015 °[employer]vier Interactive Patient Education © 2018 [employer] ° ° °Postpartum Depression and Baby Blues °The postpartum period begins right after the birth of a baby. During this time, there is often a great amount of joy and excitement. It is also a time of many changes in the life of the parents. Regardless of how many times a mother gives birth, each child brings new challenges and dynamics to the family. It is not unusual to have feelings of excitement along with confusing shifts in moods, emotions, and thoughts. All mothers are at risk of developing postpartum depression or the "baby blues." These mood changes can occur  right after giving birth, or they may occur many months after giving birth. The baby blues or postpartum depression can be mild or severe. Additionally, postpartum depression can go away rather quickly, or it can be a long-term condition. °What are the causes? °Raised hormone levels and the rapid drop in those levels are thought to be a main cause of postpartum depression and the baby blues. A number of hormones change during and after pregnancy. Estrogen and progesterone usually decrease right after the delivery of your baby. The levels of thyroid hormone and various cortisol steroids also rapidly drop. Other factors that play a role in these mood changes include major life events and genetics. °What increases the risk? °If you have any of the following risks for the baby blues or postpartum depression, know what symptoms to watch out for during the postpartum period. Risk factors that may increase the likelihood of getting the baby blues or postpartum depression include: °·   Having a personal or family history of depression. °· Having depression while being pregnant. °· Having premenstrual mood issues or mood issues related to oral contraceptives. °· Having a lot of life stress. °· Having marital conflict. °· Lacking a social support network. °· Having a baby with special needs. °· Having health problems, such as diabetes. ° °What are the signs or symptoms? °Symptoms of baby blues include: °· Brief changes in mood, such as going from extreme happiness to sadness. °· Decreased concentration. °· Difficulty sleeping. °· Crying spells, tearfulness. °· Irritability. °· Anxiety. ° °Symptoms of postpartum depression typically begin within the first month after giving birth. These symptoms include: °· Difficulty sleeping or excessive sleepiness. °· Marked weight loss. °· Agitation. °· Feelings of worthlessness. °· Lack of interest in activity or food. ° °Postpartum psychosis is a very serious condition and can be  dangerous. Fortunately, it is rare. Displaying any of the following symptoms is cause for immediate medical attention. Symptoms of postpartum psychosis include: °· Hallucinations and delusions. °· Bizarre or disorganized behavior. °· Confusion or disorientation. ° °How is this diagnosed? °A diagnosis is made by an evaluation of your symptoms. There are no medical or lab tests that lead to a diagnosis, but there are various questionnaires that a health care provider may use to identify those with the baby blues, postpartum depression, or psychosis. Often, a screening tool called the Edinburgh Postnatal Depression Scale is used to diagnose depression in the postpartum period. °How is this treated? °The baby blues usually goes away on its own in 1-2 weeks. Social support is often all that is needed. You will be encouraged to get adequate sleep and rest. Occasionally, you may be given medicines to help you sleep. °Postpartum depression requires treatment because it can last several months or longer if it is not treated. Treatment may include individual or group therapy, medicine, or both to address any social, physiological, and psychological factors that may play a role in the depression. Regular exercise, a healthy diet, rest, and social support may also be strongly recommended. °Postpartum psychosis is more serious and needs treatment right away. Hospitalization is often needed. °Follow these instructions at home: °· Get as much rest as you can. Nap when the baby sleeps. °· Exercise regularly. Some women find yoga and walking to be beneficial. °· Eat a balanced and nourishing diet. °· Do little things that you enjoy. Have a cup of tea, take a bubble bath, read your favorite magazine, or listen to your favorite music. °· Avoid alcohol. °· Ask for help with household chores, cooking, grocery shopping, or running errands as needed. Do not try to do everything. °· Talk to people close to you about how you are feeling.  Get support from your partner, family members, friends, or other new moms. °· Try to stay positive in how you think. Think about the things you are grateful for. °· Do not spend a lot of time alone. °· Only take over-the-counter or prescription medicine as directed by your health care provider. °· Keep all your postpartum appointments. °· Let your health care provider know if you have any concerns. °Contact a health care provider if: °You are having a reaction to or problems with your medicine. °Get help right away if: °· You have suicidal feelings. °· You think you may harm the baby or someone else. °This information is not intended to replace advice given to you by your health care provider. Make sure you discuss any questions you have   with your health care provider. °Document Released: 12/10/2003 Document Revised: 08/13/2015 Document Reviewed: 12/17/2012 °Elsevier Interactive Patient Education © 2017 [employer] ° °

## 2017-02-20 NOTE — Progress Notes (Signed)
Discharged home, ambulatory, stable, with infant.

## 2017-02-20 NOTE — Lactation Note (Signed)
This note was copied from a baby's chart. Lactation Consultation Note  Patient Name: Suzanne Flores ZOXWR'UToday's Date: 02/20/2017 Reason for consult: Follow-up assessment;Infant weight loss  Baby is 60 hours  LC reviewed and updated the doc flow sheets per mom  Baby showing signs of hunger and after mom changed a wet and stool  Diaper LC assisted mom to latch on the left breast, breast full, depth achieved  And multiple swallows noted, increased with breast compressions.  Baby fed for 17 mins, nipple well rounded when abby released.  Mom denies soreness, and hasn't had to use the NS for latching lately. Sore nipple and engorgement prevention and tx reviewed. Per mom was given A hand pump . LC instructed mom on the use of shells. Mother informed of post-discharge support and given phone number to the lactation department, including services for phone call assistance; out-patient appointments; and breastfeeding support group. List of other breastfeeding resources in the community given in the handout. Encouraged mother to call for problems or concerns related to breastfeeding.   Maternal Data Has patient been taught Hand Expression?: Yes  Feeding Feeding Type: Breast Milk Length of feed: (baby swallowing / increased with breast compressions )  LATCH Score Latch: Grasps breast easily, tongue down, lips flanged, rhythmical sucking.  Audible Swallowing: Spontaneous and intermittent  Type of Nipple: Everted at rest and after stimulation  Comfort (Breast/Nipple): Filling, red/small blisters or bruises, mild/mod discomfort  Hold (Positioning): Assistance needed to correctly position infant at breast and maintain latch.  LATCH Score: 8  Interventions Interventions: Breast feeding basics reviewed;Assisted with latch;Skin to skin;Breast massage;Hand express;Pre-pump if needed;Breast compression;Adjust position;Position options;Shells;Hand pump  Lactation Tools Discussed/Used Tools:  Shells;Pump Shell Type: Inverted Breast pump type: Manual Pump Review: Milk Storage Initiated by:: MAI  Date initiated:: 02/20/17   Consult Status Consult Status: Complete Date: 02/20/17 Follow-up type: In-patient    Suzanne Flores 02/20/2017, 10:38 AM

## 2017-02-20 NOTE — Discharge Summary (Signed)
OB Discharge Summary     Patient Name: Suzanne Flores DOB: 06/12/1995 MRN: [id number]  Date of admission: 02/17/2017 Delivering MD: Myna HidalgoZAN, JENNIFER   Date of discharge: 02/20/2017  Admitting diagnosis: 39 WEEKS CTX Intrauterine pregnancy: 703w3d     Secondary diagnosis:  Principal Problem:   S/P cesarean section Active Problems:   Indication for care in labor or delivery   Preeclampsia   Chorioamnionitis, delivered, current hospitalization  Additional problems: None     Discharge diagnosis: Term Pregnancy Delivered                                                                                                Post partum procedures:None  Augmentation: AROM and Pitocin  Complications: Intrauterine Inflammation or infection (Chorioamniotis)  Hospital course:  Onset of Labor With Unplanned C/S  21 y.o. yo G1P1001 at 543w3d was admitted in Active Labor on 02/17/2017. Patient had a labor course significant for Chorio, PreEclampsia, and Arrest of Dilation. Membrane Rupture Time/Date: 11:30 PM ,02/16/2017   The patient went for cesarean section due to Arrest of Dilation, and delivered a Viable infant,02/17/2017  Details of operation can be found in separate operative note. Patient had an uncomplicated postpartum course.  She is ambulating,tolerating a regular diet, passing flatus, and urinating well.  Patient is discharged home in stable condition 02/20/17.  Physical exam  Vitals:   02/19/17 1530 02/19/17 1952 02/20/17 0010 02/20/17 0500  BP: 109/69 (!) 132/97 127/78 120/80  Pulse: (!) 103 (!) 108 83 86  Resp: 18 18 18 18   Temp: 98.3 F (36.8 C) 99.2 F (37.3 C) 98.3 F (36.8 C) 98 F (36.7 C)  TempSrc: Oral Oral Oral Oral  SpO2: 99% 99% 100% 100%  Weight:      Height:       General: alert, cooperative and no distress  Chest: HRRR, Lungs CTA Abd: BS x 4, Mild Tenderness, Soft Lochia: appropriate Uterine Fundus: firm U/-1 Incision: Healing well with no significant drainage.  Honeycomb removed-Steri strips in place DVT Evaluation: No cords or calf tenderness. Calf/Ankle edema is present Labs: Lab Results  Component Value Date   WBC 22.2 (H) 02/18/2017   HGB 10.0 (L) 02/18/2017   HCT 30.1 (L) 02/18/2017   MCV 81.1 02/18/2017   PLT 278 02/18/2017   CMP Latest Ref Rng & Units 02/18/2017  Glucose 65 - 99 mg/dL 98  BUN 6 - 20 mg/dL 9  Creatinine 4.090.44 - 8.111.00 mg/dL 9.140.53  Sodium 782135 - 956145 mmol/L 133(L)  Potassium 3.5 - 5.1 mmol/L 4.3  Chloride 101 - 111 mmol/L 103  CO2 22 - 32 mmol/L 21(L)  Calcium 8.9 - 10.3 mg/dL 8.2(L)  Total Protein 6.5 - 8.1 g/dL 2.1(H5.4(L)  Total Bilirubin 0.3 - 1.2 mg/dL 0.8  Alkaline Phos 38 - 126 U/L 259(H)  AST 15 - 41 U/L 27  ALT 14 - 54 U/L 11(L)    Discharge instruction: per After Visit Summary and "Baby and Me Booklet". Remove steri-strips in 7-10 days.  Incision care guidelines: how to clean, when to call, and anticipated healing. Pain Management, Peri-Care, Breastfeeding, Who  and When to call for postpartum complications. Information Sheet(s) given PPD&BB, Care after C/S   After visit meds:  Allergies as of 02/20/2017   No Known Allergies     Medication List    STOP taking these medications   sertraline 25 MG tablet Commonly known as:  ZOLOFT     TAKE these medications   ibuprofen 600 MG tablet Commonly known as:  ADVIL,MOTRIN Take 1 tablet (600 mg total) by mouth every 6 (six) hours as needed.   polyethylene glycol powder powder Commonly known as:  GLYCOLAX/MIRALAX Take 17 g by mouth daily as needed.   Prenatal Vitamin 27-0.8 MG Tabs Take 1 capsule by mouth daily.       Diet: routine diet  Activity: Advance as tolerated. Pelvic rest for 6 weeks.   Outpatient follow up:6 weeks Follow up Appt:No future appointments. Follow up Visit:No Follow-up on file.  Postpartum contraception: Condoms  Newborn Data: Live born female  Birth Weight: 6 lb 15.5 oz (3160 g) APGAR: 10, 10  Newborn Delivery   Birth  date/time:  02/17/2017 22:32:00 Delivery type:  C-Section, Low Transverse C-section categorization:  Primary     Baby Feeding: Breast Disposition:home with mother   02/20/2017 Cherre RobinsJessica L Rachana Malesky, CNM

## 2017-06-13 ENCOUNTER — Other Ambulatory Visit (HOSPITAL_COMMUNITY): Payer: Self-pay | Admitting: Obstetrics and Gynecology

## 2017-06-13 DIAGNOSIS — R109 Unspecified abdominal pain: Secondary | ICD-10-CM

## 2017-06-13 DIAGNOSIS — R102 Pelvic and perineal pain: Secondary | ICD-10-CM

## 2017-06-20 ENCOUNTER — Ambulatory Visit (HOSPITAL_COMMUNITY): Admission: RE | Admit: 2017-06-20 | Payer: Medicaid Other | Source: Ambulatory Visit

## 2017-06-22 ENCOUNTER — Ambulatory Visit (HOSPITAL_COMMUNITY)
Admission: RE | Admit: 2017-06-22 | Discharge: 2017-06-22 | Disposition: A | Discharge status: Home / Self Care | Payer: Medicaid Other | Source: Ambulatory Visit | Attending: Obstetrics and Gynecology | Admitting: Obstetrics and Gynecology

## 2017-06-22 DIAGNOSIS — R102 Pelvic and perineal pain unspecified side: Secondary | ICD-10-CM

## 2017-06-22 DIAGNOSIS — R109 Unspecified abdominal pain: Secondary | ICD-10-CM

## 2018-08-31 ENCOUNTER — Emergency Department (HOSPITAL_BASED_OUTPATIENT_CLINIC_OR_DEPARTMENT_OTHER)
Admission: EM | Admit: 2018-08-31 | Discharge: 2018-08-31 | Disposition: A | Discharge status: Home / Self Care | Payer: Medicaid Other | Attending: Emergency Medicine | Admitting: Emergency Medicine

## 2018-08-31 ENCOUNTER — Other Ambulatory Visit: Payer: Self-pay

## 2018-08-31 ENCOUNTER — Encounter (HOSPITAL_BASED_OUTPATIENT_CLINIC_OR_DEPARTMENT_OTHER): Payer: Self-pay | Admitting: *Deleted

## 2018-08-31 DIAGNOSIS — Z79899 Other long term (current) drug therapy: Secondary | ICD-10-CM | POA: Diagnosis not present

## 2018-08-31 DIAGNOSIS — Z3202 Encounter for pregnancy test, result negative: Secondary | ICD-10-CM | POA: Diagnosis not present

## 2018-08-31 DIAGNOSIS — R1084 Generalized abdominal pain: Secondary | ICD-10-CM | POA: Diagnosis not present

## 2018-08-31 DIAGNOSIS — R110 Nausea: Secondary | ICD-10-CM | POA: Diagnosis present

## 2018-08-31 LAB — CBC
HCT: 38.1 % (ref 36.0–46.0)
Hemoglobin: 12.2 g/dL (ref 12.0–15.0)
MCH: 26.5 pg (ref 26.0–34.0)
MCHC: 32 g/dL (ref 30.0–36.0)
MCV: 82.8 fL (ref 80.0–100.0)
Platelets: 297 10*3/uL (ref 150–400)
RBC: 4.6 MIL/uL (ref 3.87–5.11)
RDW: 12.7 % (ref 11.5–15.5)
WBC: 10 10*3/uL (ref 4.0–10.5)
nRBC: 0 % (ref 0.0–0.2)

## 2018-08-31 LAB — URINALYSIS, ROUTINE W REFLEX MICROSCOPIC
Bilirubin Urine: NEGATIVE
Glucose, UA: NEGATIVE mg/dL
Hgb urine dipstick: NEGATIVE
Ketones, ur: NEGATIVE mg/dL
Leukocytes,Ua: NEGATIVE
Nitrite: NEGATIVE
Protein, ur: NEGATIVE mg/dL
Specific Gravity, Urine: 1.015 (ref 1.005–1.030)
pH: 7 (ref 5.0–8.0)

## 2018-08-31 LAB — WET PREP, GENITAL
Clue Cells Wet Prep HPF POC: NONE SEEN
Sperm: NONE SEEN
Trich, Wet Prep: NONE SEEN
Yeast Wet Prep HPF POC: NONE SEEN

## 2018-08-31 LAB — COMPREHENSIVE METABOLIC PANEL
ALT: 12 U/L (ref 0–44)
AST: 17 U/L (ref 15–41)
Albumin: 3.9 g/dL (ref 3.5–5.0)
Alkaline Phosphatase: 106 U/L (ref 38–126)
Anion gap: 9 (ref 5–15)
BUN: 11 mg/dL (ref 6–20)
CO2: 24 mmol/L (ref 22–32)
Calcium: 9.3 mg/dL (ref 8.9–10.3)
Chloride: 106 mmol/L (ref 98–111)
Creatinine, Ser: 0.62 mg/dL (ref 0.44–1.00)
GFR calc Af Amer: >60 mL/min (ref >60)
GFR calc non Af Amer: >60 mL/min (ref >60)
Glucose, Bld: 106 mg/dL — ABNORMAL HIGH (ref 70–99)
Potassium: 3.7 mmol/L (ref 3.5–5.1)
Sodium: 139 mmol/L (ref 135–145)
Total Bilirubin: 0.4 mg/dL (ref 0.3–1.2)
Total Protein: 7.7 g/dL (ref 6.5–8.1)

## 2018-08-31 LAB — LIPASE, BLOOD: Lipase: 26 U/L (ref 11–51)

## 2018-08-31 LAB — PREGNANCY, URINE: Preg Test, Ur: NEGATIVE

## 2018-08-31 MED ORDER — SODIUM CHLORIDE 0.9% FLUSH
3.0000 mL | Freq: Once | INTRAVENOUS | Status: DC
  Filled 2018-08-31: qty 3

## 2018-08-31 NOTE — ED Provider Notes (Signed)
[facility] EMERGENCY DEPARTMENT Provider Note   CSN: [id number] Arrival date & time: 08/31/18  1725    History   Chief Complaint Chief Complaint  Patient presents with  . Abdominal Pain    HPI Suzanne Flores is a 23 y.o. female.     The history is provided by the patient and medical records. No language interpreter was used.  Abdominal Pain Associated symptoms: nausea   Associated symptoms: no constipation, no diarrhea and no vomiting    Suzanne Flores is a 23 y.o. female  with a PMH as listed below who presents to the Emergency Department complaining of lysed abdominal pain for the last 2 days.  Associated with nausea, but no vomiting, diarrhea, constipation.  Patient states that at onset of symptoms, she had some mild vaginal bleeding.  Yesterday, she had a much larger amount of vaginal bleeding and her pain worsened.  She took a pregnancy test and it was positive.  She waited a few hours, then took another and felt as if the line was much more faint.  She then took a third pregnancy test this morning which was negative.  She has not had much vaginal bleeding today, just spotting.  Feels as if it is resolving.  The abdominal pain has been persistent however.  No fever or chills.  She does report some mild dysuria this morning as well.  No medications taken prior to arrival for symptoms.  Of note, she states that she missed 4 of her oral contraceptive pills, therefore took all 4 of these pills the day before her symptoms began.  She is not sure if this is contributing or not.  Past Medical History:  Diagnosis Date  . Anxiety     Patient Active Problem List   Diagnosis Date Noted  . S/P cesarean section 02/20/2017  . Preeclampsia 02/20/2017  . Chorioamnionitis, delivered, current hospitalization 02/20/2017  . Indication for care in labor or delivery 02/17/2017    Past Surgical History:  Procedure Laterality Date  . CESAREAN SECTION N/A 02/17/2017   Procedure: CESAREAN  SECTION;  Surgeon: Myna Hidalgozan, Jennifer, DO;  Location: [facility];  Service: Obstetrics;  Laterality: N/A;  . WISDOM TOOTH EXTRACTION       OB History    Gravida  1   Para  1   Term  1   Preterm      AB      Living  1     SAB      TAB      Ectopic      Multiple  0   Live Births  1            Home Medications    Prior to Admission medications   Medication Sig Start Date End Date Taking? Authorizing Provider  ibuprofen (ADVIL,MOTRIN) 600 MG tablet Take 1 tablet (600 mg total) by mouth every 6 (six) hours as needed. 02/20/17   Gerrit HeckEmly, Jessica, CNM  polyethylene glycol powder (GLYCOLAX/MIRALAX) powder Take 17 g by mouth daily as needed. Patient not taking: Reported on 06/14/2016 04/05/16   Morrell RiddleWeber, Sarah L, PA-C  Prenatal Vit-Fe Fumarate-FA (PRENATAL VITAMIN) 27-0.8 MG TABS Take 1 capsule by mouth daily. 06/14/16   Jacalyn LefevreHaviland, Julie, MD    Family History No family history on file.  Social History Social History   Tobacco Use  . Smoking status: Never Smoker  . Smokeless tobacco: Never Used  Substance Use Topics  . Alcohol use: No  . Drug use: No  Allergies   Patient has no known allergies.   Review of Systems Review of Systems  Gastrointestinal: Positive for abdominal pain and nausea. Negative for blood in stool, constipation, diarrhea and vomiting.  All other systems reviewed and are negative.    Physical Exam Updated Vital Signs BP 116/72   Pulse (!) 105   Temp 98.2 F (36.8 C) (Oral)   Resp 20   Ht 5' 3.5" (1.613 m)   Wt 71 kg   LMP 08/19/2018   SpO2 99%   BMI 27.29 kg/m   Physical Exam Vitals signs and nursing note reviewed.  Constitutional:      General: She is not in acute distress.    Appearance: She is well-developed.     Comments: Well-appearing.  HENT:     Head: Normocephalic and atraumatic.  Neck:     Musculoskeletal: Neck supple.  Cardiovascular:     Rate and Rhythm: Normal rate and regular rhythm.     Heart sounds:  Normal heart sounds. No murmur.  Pulmonary:     Effort: Pulmonary effort is normal. No respiratory distress.     Breath sounds: Normal breath sounds.  Abdominal:     General: There is no distension.     Palpations: Abdomen is soft.     Comments: Generalized abdominal tenderness without rebound or guarding.  No focal tenderness at McBurney's.  Negative Murphy's.  No CVA tenderness.  Genitourinary:    Comments: Chaperone present for exam. + white discharge. No CMT. No adnexal masses, tenderness, or fullness.  Minimal bleeding within vaginal vault. Skin:    General: Skin is warm and dry.  Neurological:     Mental Status: She is alert and oriented to person, place, and time.      ED Treatments / Results  Labs (all labs ordered are listed, but only abnormal results are displayed) Labs Reviewed  WET PREP, GENITAL - Abnormal; Notable for the following components:      Result Value   WBC, Wet Prep HPF POC MANY (*)    All other components within normal limits  COMPREHENSIVE METABOLIC PANEL - Abnormal; Notable for the following components:   Glucose, Bld 106 (*)    All other components within normal limits  LIPASE, BLOOD  CBC  URINALYSIS, ROUTINE W REFLEX MICROSCOPIC  PREGNANCY, URINE  GC/CHLAMYDIA PROBE AMP ([place]) NOT AT [facility]    EKG None  Radiology No results found.  Procedures Procedures (including critical care time)  Medications Ordered in ED Medications  sodium chloride flush (NS) 0.9 % injection 3 mL (3 mLs Intravenous Not Given 08/31/18 1902)     Initial Impression / Assessment and Plan / ED Course  I have reviewed the triage vital signs and the nursing notes.  Pertinent labs & imaging results that were available during my care of the patient were reviewed by me and considered in my medical decision making (see chart for details).       Suzanne Flores is a 23 y.o. female who presents to ED for generalized abdominal pain associated with vaginal bleeding.  She  missed several days of her OCPs, then took all of them at once the day before her symptoms began.  She reports taking pregnancy test which was positive, then take another several hours later which was more faint, then a third test this morning which was negative.  On my examination, she is afebrile, hemodynamically stable with generalized abdominal tenderness, but no rebound or guarding.  No peritoneal signs.  Urinalysis  without signs of infection.  Labs reassuring.  Pregnancy negative.  No cervical motion or adnexal tenderness on GU exam.  No active cervical bleeding or blood in vaginal vault.  G&C obtained.  Discussed prophylactic antibiotics, but she would like to wait for results.  She reports always using condoms, therefore is less concerned for STDs.  Vaginal bleeding could be due to change in OCP dosing versus early miscarriage.  Repeat abdominal exam with nonsurgical abdomen. Evaluation does not show pathology that would require ongoing emergent intervention or inpatient treatment.  PCP versus OB/GYN follow-up encouraged.  Reasons to return to the emergency department discussed and all questions answered.    Final Clinical Impressions(s) / ED Diagnoses   Final diagnoses:  Generalized abdominal pain    ED Discharge Orders    None       Jacquelyn Antony, Chase PicketJaime Pilcher, PA-C 08/31/18 1957    Jacalyn LefevreHaviland, Julie, MD 08/31/18 2136

## 2018-08-31 NOTE — Discharge Instructions (Signed)
It was my pleasure taking care of you today!   Fortunately, your lab work and urine was very reassuring today.   It is VERY important that you monitor your symptoms and return to the Emergency Department if you develop any of the following symptoms:  You have a fever.  You begin throwing up.  You pass bloody or black tarry stools.  There is bright red blood in the stool. New or worsening symptoms develop.  You have any questions or concerns.

## 2018-08-31 NOTE — ED Notes (Signed)
ED Provider at bedside. 

## 2018-08-31 NOTE — ED Triage Notes (Signed)
Mid abdominal pain x 2 days. Pain is worse today. Nausea. Headache.

## 2018-09-03 LAB — GC/CHLAMYDIA PROBE AMP (~~LOC~~) NOT AT ARMC
Chlamydia: NEGATIVE
Neisseria Gonorrhea: NEGATIVE

## 2019-06-03 DIAGNOSIS — H9201 Otalgia, right ear: Secondary | ICD-10-CM | POA: Insufficient documentation

## 2019-06-07 ENCOUNTER — Other Ambulatory Visit: Payer: Self-pay

## 2019-06-07 ENCOUNTER — Emergency Department (HOSPITAL_COMMUNITY)
Admission: EM | Admit: 2019-06-07 | Discharge: 2019-06-07 | Disposition: A | Discharge status: Home / Self Care | Payer: Medicaid Other | Attending: Emergency Medicine | Admitting: Emergency Medicine

## 2019-06-07 ENCOUNTER — Emergency Department (HOSPITAL_COMMUNITY): Payer: Medicaid Other

## 2019-06-07 ENCOUNTER — Encounter (HOSPITAL_COMMUNITY): Payer: Self-pay

## 2019-06-07 DIAGNOSIS — R102 Pelvic and perineal pain: Secondary | ICD-10-CM

## 2019-06-07 DIAGNOSIS — O26891 Other specified pregnancy related conditions, first trimester: Secondary | ICD-10-CM | POA: Insufficient documentation

## 2019-06-07 DIAGNOSIS — Z3A01 Less than 8 weeks gestation of pregnancy: Secondary | ICD-10-CM | POA: Insufficient documentation

## 2019-06-07 DIAGNOSIS — R1084 Generalized abdominal pain: Secondary | ICD-10-CM | POA: Diagnosis not present

## 2019-06-07 LAB — URINALYSIS, ROUTINE W REFLEX MICROSCOPIC
Bilirubin Urine: NEGATIVE
Glucose, UA: NEGATIVE mg/dL
Hgb urine dipstick: NEGATIVE
Ketones, ur: NEGATIVE mg/dL
Leukocytes,Ua: NEGATIVE
Nitrite: NEGATIVE
Protein, ur: NEGATIVE mg/dL
Specific Gravity, Urine: 1.008 (ref 1.005–1.030)
pH: 8 (ref 5.0–8.0)

## 2019-06-07 LAB — CBC
HCT: 42.2 % (ref 36.0–46.0)
Hemoglobin: 13.4 g/dL (ref 12.0–15.0)
MCH: 26.5 pg (ref 26.0–34.0)
MCHC: 31.8 g/dL (ref 30.0–36.0)
MCV: 83.6 fL (ref 80.0–100.0)
Platelets: 362 10*3/uL (ref 150–400)
RBC: 5.05 MIL/uL (ref 3.87–5.11)
RDW: 13.5 % (ref 11.5–15.5)
WBC: 12.1 10*3/uL — ABNORMAL HIGH (ref 4.0–10.5)
nRBC: 0 % (ref 0.0–0.2)

## 2019-06-07 LAB — LIPASE, BLOOD: Lipase: 20 U/L (ref 11–51)

## 2019-06-07 LAB — COMPREHENSIVE METABOLIC PANEL
ALT: 14 U/L (ref 0–44)
AST: 15 U/L (ref 15–41)
Albumin: 4.3 g/dL (ref 3.5–5.0)
Alkaline Phosphatase: 125 U/L (ref 38–126)
Anion gap: 8 (ref 5–15)
BUN: 6 mg/dL (ref 6–20)
CO2: 23 mmol/L (ref 22–32)
Calcium: 9.2 mg/dL (ref 8.9–10.3)
Chloride: 105 mmol/L (ref 98–111)
Creatinine, Ser: 0.58 mg/dL (ref 0.44–1.00)
GFR calc Af Amer: >60 mL/min (ref >60)
GFR calc non Af Amer: >60 mL/min (ref >60)
Glucose, Bld: 100 mg/dL — ABNORMAL HIGH (ref 70–99)
Potassium: 3.8 mmol/L (ref 3.5–5.1)
Sodium: 136 mmol/L (ref 135–145)
Total Bilirubin: 0.8 mg/dL (ref 0.3–1.2)
Total Protein: 8 g/dL (ref 6.5–8.1)

## 2019-06-07 LAB — I-STAT BETA HCG BLOOD, ED (MC, WL, AP ONLY): I-stat hCG, quantitative: 800.8 m[IU]/mL — ABNORMAL HIGH (ref <5)

## 2019-06-07 LAB — HCG, QUANTITATIVE, PREGNANCY: hCG, Beta Chain, Quant, S: 979 m[IU]/mL — ABNORMAL HIGH (ref <5)

## 2019-06-07 MED ORDER — SODIUM CHLORIDE 0.9 % IV BOLUS
1000.0000 mL | Freq: Once | INTRAVENOUS | Status: AC
  Administered 2019-06-07: 1000 mL via INTRAVENOUS

## 2019-06-07 MED ORDER — SODIUM CHLORIDE 0.9% FLUSH
3.0000 mL | Freq: Once | INTRAVENOUS | Status: AC
  Administered 2019-06-07: 15:00:00 3 mL via INTRAVENOUS

## 2019-06-07 MED ORDER — PRENATAL COMPLETE 14-0.4 MG PO TABS: ORAL_TABLET | ORAL | 3 refills | Status: AC

## 2019-06-07 NOTE — ED Provider Notes (Signed)
[facility]-EMERGENCY DEPT Provider Note   CSN: [id number] Arrival date & time: 06/07/19  1127    History Chief Complaint  Patient presents with  . Abdominal Pain    Suzanne Flores is a 24 y.o. female with past medical history significant for anxiety who presents for evaluation of abdominal pain.  Patient states she has had neurolyse abdominal pain x4 days.  Radiating to her bilateral lower back.  She has no chest pain or shortness of breath.  No pelvic pain or vaginal discharge.  States she had similar symptoms last year when she was pregnant.  Her last menstrual cycle was approximately 1 month ago.  She took a pregnancy test at home which was positive.  She is followed by [facility].  She called today and they could not get her in for an appointment so told her to come to the emergency room to be evaluated.  Her pain a 7/10.  She has been taking ibuprofen at home without relief of pain however she does not anything for pain at this time.  She denies fever, chills, nausea, vomiting, chest pain, shortness of breath, midline back pain, dysuria, vaginal discharge, concerns for STDs.  She is sexually active.  Denies aggravating or alleviating factors.  Patient does not want pelvic exam or STD check.  History obtained from patient and past medical records.  No interpreter is used.  HPI     Past Medical History:  Diagnosis Date  . Anxiety     Patient Active Problem List   Diagnosis Date Noted  . S/P cesarean section 02/20/2017  . Preeclampsia 02/20/2017  . Chorioamnionitis, delivered, current hospitalization 02/20/2017  . Indication for care in labor or delivery 02/17/2017    Past Surgical History:  Procedure Laterality Date  . CESAREAN SECTION N/A 02/17/2017   Procedure: CESAREAN SECTION;  Surgeon: Myna Hidalgo, DO;  Location: [facility];  Service: Obstetrics;  Laterality: N/A;  . WISDOM TOOTH EXTRACTION       OB History    Gravida  1   Para  1   Term  1   Preterm      AB      Living  1     SAB      TAB      Ectopic      Multiple  0   Live Births  1           Family History  Problem Relation Age of Onset  . Thyroid disease Mother     Social History   Tobacco Use  . Smoking status: Never Smoker  . Smokeless tobacco: Never Used  Substance Use Topics  . Alcohol use: No  . Drug use: No    Home Medications Prior to Admission medications   Medication Sig Start Date End Date Taking? Authorizing Provider  acetaminophen (TYLENOL) 500 MG tablet Take 500 mg by mouth every 6 (six) hours as needed for mild pain, moderate pain or headache.   Yes [provider]  ibuprofen (ADVIL) 200 MG tablet Take 200 mg by mouth every 6 (six) hours as needed for headache, mild pain or moderate pain.   Yes [provider]  ibuprofen (ADVIL,MOTRIN) 600 MG tablet Take 1 tablet (600 mg total) by mouth every 6 (six) hours as needed. Patient not taking: Reported on 06/07/2019 02/20/17   Gerrit Heck, CNM  polyethylene glycol powder (GLYCOLAX/MIRALAX) powder Take 17 g by mouth daily as needed. Patient not taking: Reported on  06/14/2016 04/05/16   Valarie Cones, Dema Severin, PA-C  Prenatal Vit-Fe Fumarate-FA (PRENATAL COMPLETE) 14-0.4 MG TABS Take 1 tablet daily 06/07/19   Teancum Brule A, PA-C    Allergies    Tape, Avocado, and Cinnamon  Review of Systems   Review of Systems  Constitutional: Negative.   HENT: Negative.   Eyes: Negative.   Respiratory: Negative.   Cardiovascular: Negative.   Gastrointestinal: Positive for abdominal pain. Negative for abdominal distention, anal bleeding, blood in stool, constipation, diarrhea, nausea, rectal pain and vomiting.  Genitourinary: Negative.   Musculoskeletal: Negative.   Skin: Negative.   Neurological: Negative.   All other systems reviewed and are negative.   Physical Exam Updated Vital Signs BP 112/82 (BP Location: Left Arm)   Pulse (!) 105   Temp 98.7 F  (37.1 C) (Oral)   Resp 18   Ht 5' 3.5" (1.613 m)   Wt 75.3 kg   LMP 05/10/2019   SpO2 100%   BMI 28.94 kg/m   Physical Exam Vitals and nursing note reviewed.  Constitutional:      General: She is not in acute distress.    Appearance: She is well-developed. She is not ill-appearing, toxic-appearing or diaphoretic.  HENT:     Head: Normocephalic and atraumatic.     Mouth/Throat:     Mouth: Mucous membranes are moist.     Pharynx: Oropharynx is clear.  Eyes:     Pupils: Pupils are equal, round, and reactive to light.  Cardiovascular:     Rate and Rhythm: Tachycardia present.     Heart sounds: Normal heart sounds.  Pulmonary:     Effort: Pulmonary effort is normal. No respiratory distress.     Breath sounds: Normal breath sounds.  Abdominal:     General: Bowel sounds are normal. There is no distension.     Palpations: Abdomen is soft.     Tenderness: There is generalized abdominal tenderness. There is right CVA tenderness and left CVA tenderness. There is no guarding or rebound.     Hernia: No hernia is present.  Musculoskeletal:        General: Normal range of motion.     Cervical back: Normal range of motion.  Skin:    General: Skin is warm and dry.     Comments: No overlying skin changes  Neurological:     Mental Status: She is alert.    ED Results / Procedures / Treatments   Labs (all labs ordered are listed, but only abnormal results are displayed) Labs Reviewed  COMPREHENSIVE METABOLIC PANEL - Abnormal; Notable for the following components:      Result Value   Glucose, Bld 100 (*)    All other components within normal limits  CBC - Abnormal; Notable for the following components:   WBC 12.1 (*)    All other components within normal limits  URINALYSIS, ROUTINE W REFLEX MICROSCOPIC - Abnormal; Notable for the following components:   Color, Urine STRAW (*)    All other components within normal limits  HCG, QUANTITATIVE, PREGNANCY - Abnormal; Notable for the  following components:   hCG, Beta Chain, Quant, S 979 (*)    All other components within normal limits  I-STAT BETA HCG BLOOD, ED ([facility], [facility], [facility] ONLY) - Abnormal; Notable for the following components:   I-stat hCG, quantitative 800.8 (*)    All other components within normal limits  LIPASE, BLOOD    EKG None  Radiology US Renal  Result Date: 06/07/2019 CLINICAL DATA:  Pregnant with abdominal pain EXAM: RENAL / URINARY TRACT ULTRASOUND COMPLETE COMPARISON:  None FINDINGS: Right Kidney: Renal measurements: 9.8 x 4.2 x 5.1 cm = volume: 109 mL . Echogenicity within normal limits. No mass or hydronephrosis visualized. Left Kidney: Renal measurements: 9.4 x 4.8 x 4.8 cm = volume: 114 mL. Echogenicity within normal limits. No mass or hydronephrosis visualized. Bladder: Appears normal for degree of bladder distention. Other: None. IMPRESSION: 1. Normal renal sonogram. Electronically Signed   By: Donzetta Kohut M.D.   On: 06/07/2019 17:33   US OB LESS THAN 14 WEEKS WITH OB TRANSVAGINAL  Result Date: 06/07/2019 CLINICAL DATA:  Pelvic pain. EXAM: OBSTETRIC <14 WK [place] AND TRANSVAGINAL OB US TECHNIQUE: Both transabdominal and transvaginal ultrasound examinations were performed for complete evaluation of the gestation as well as the maternal uterus, adnexal regions, and pelvic cul-de-sac. Transvaginal technique was performed to assess early pregnancy. COMPARISON:  June 22, 2017. FINDINGS: Intrauterine gestational sac: None Yolk sac:  Not Visualized. Embryo:  Not Visualized. Maternal uterus/adnexae: Probable corpus luteum cyst seen in left ovary. Right ovary is normal. Trace free fluid is noted which most likely is physiologic. Thickened endometrium is noted at 16 mm. IMPRESSION: No intrauterine gestational sac, yolk sac, fetal pole, or cardiac activity visualized. Differential considerations include intrauterine gestation too early to be sonographically visualized, spontaneous abortion, or ectopic pregnancy.  Consider follow-up ultrasound in 14 days and serial quantitative beta HCG follow-up. Electronically Signed   By: Lupita Raider M.D.   On: 06/07/2019 17:35    Procedures Procedures (including critical care time)  Medications Ordered in ED Medications  sodium chloride flush (NS) 0.9 % injection 3 mL (3 mLs Intravenous Given 06/07/19 1526)  sodium chloride 0.9 % bolus 1,000 mL (1,000 mLs Intravenous New Bag/Given 06/07/19 1526)    ED Course  I have reviewed the triage vital signs and the nursing notes.  Pertinent labs & imaging results that were available during my care of the patient were reviewed by me and considered in my medical decision making (see chart for details).  24 year old female presents for evaluation abdominal pain and flank pain.  No urinary complaints.  States had positive home pregnancy test yesterday.  LMP 1 month ago.  Followed by [facility].  She is tachycardic however does not appear septic in room.  Nonsurgical abdomen.  We will plan on labs, fluids and reevaluate.  Patient reassessed.  Tachycardia improved with IV fluids.  Reassess continue benign abdominal exam.  Lab work reviewed personally interpreted.  She does have hCG quant of 979.  Discussed pelvic exam.  Patient declines pelvic exam at this time.  Discussed risk versus benefit.  Patient voiced understanding continues to decline. Understands possible missed STD, PID.  Ultrasound renal without any acute abnormality, urinalysis negative Ultrasound pelvis does not show IUP.  Discussed with patient possible early abortion, missed ectopic versus too early in pregnancy.  She does not currently have any abdominal pain on my reevaluation.  Requesting DC home.  Discussed return precautions.  She is to start on prenatal vitamins.  Discussed bleeding precautions and to return to [facility] for any significant normality.  She is to follow-up with [facility] for reevaluation.  No vaginal bleeding to suggest needing  RhoGam.  Patient is nontoxic, nonseptic appearing, in no apparent distress.  Patient's pain and other symptoms adequately managed in emergency department.  Fluid bolus given.  Labs, imaging and vitals reviewed.  Patient does not meet the SIRS or Sepsis criteria.  On repeat exam patient does not have a surgical abdomin and there are no peritoneal signs.  No indication of appendicitis, bowel obstruction, bowel perforation, cholecystitis, diverticulitis, PID or ectopic pregnancy.  Patient discharged home with symptomatic treatment and given strict instructions for follow-up with their primary care physician.  I have also discussed reasons to return immediately to the ER.  Patient expresses understanding and agrees with plan.      MDM Rules/Calculators/A&P                       Final Clinical Impression(s) / ED Diagnoses Final diagnoses:  Generalized abdominal pain  Less than [redacted] weeks gestation of pregnancy    Rx / DC Orders ED Discharge Orders         Ordered    Prenatal Vit-Fe Fumarate-FA (PRENATAL COMPLETE) 14-0.4 MG TABS     06/07/19 1825           Leanore Biggers A, PA-C 06/07/19 1826    Wyvonnia Dusky, MD 06/07/19 1902

## 2019-06-07 NOTE — Discharge Instructions (Signed)
You need to follow up with Obgyn on Monday for repeat labs. Return to the [facility] ED for new or worsening symptoms.

## 2019-06-07 NOTE — ED Notes (Signed)
Couldn't find a vein to draw blood

## 2019-06-07 NOTE — ED Triage Notes (Signed)
Patient c/o mid abdominal pain x 4 days and states that she began having flank pain bilaterally last night. Patient denies any dysuria, N/v/D.

## 2019-06-15 ENCOUNTER — Other Ambulatory Visit: Payer: Self-pay

## 2019-06-15 ENCOUNTER — Inpatient Hospital Stay (HOSPITAL_COMMUNITY): Payer: Medicaid Other

## 2019-06-15 ENCOUNTER — Inpatient Hospital Stay (HOSPITAL_COMMUNITY)
Admission: AD | Admit: 2019-06-15 | Discharge: 2019-06-15 | Disposition: A | Discharge status: Home / Self Care | Payer: Medicaid Other | Attending: Obstetrics and Gynecology | Admitting: Obstetrics and Gynecology

## 2019-06-15 ENCOUNTER — Encounter (HOSPITAL_COMMUNITY): Payer: Self-pay

## 2019-06-15 DIAGNOSIS — O26891 Other specified pregnancy related conditions, first trimester: Secondary | ICD-10-CM | POA: Diagnosis not present

## 2019-06-15 DIAGNOSIS — O99891 Other specified diseases and conditions complicating pregnancy: Secondary | ICD-10-CM

## 2019-06-15 DIAGNOSIS — Z3A01 Less than 8 weeks gestation of pregnancy: Secondary | ICD-10-CM

## 2019-06-15 DIAGNOSIS — N6341 Unspecified lump in right breast, subareolar: Secondary | ICD-10-CM | POA: Insufficient documentation

## 2019-06-15 DIAGNOSIS — N63 Unspecified lump in unspecified breast: Secondary | ICD-10-CM

## 2019-06-15 DIAGNOSIS — O26899 Other specified pregnancy related conditions, unspecified trimester: Secondary | ICD-10-CM

## 2019-06-15 DIAGNOSIS — N6310 Unspecified lump in the right breast, unspecified quadrant: Secondary | ICD-10-CM

## 2019-06-15 DIAGNOSIS — O3680X0 Pregnancy with inconclusive fetal viability, not applicable or unspecified: Secondary | ICD-10-CM | POA: Diagnosis not present

## 2019-06-15 DIAGNOSIS — R102 Pelvic and perineal pain: Secondary | ICD-10-CM

## 2019-06-15 LAB — URINALYSIS, ROUTINE W REFLEX MICROSCOPIC
Bacteria, UA: NONE SEEN
Bilirubin Urine: NEGATIVE
Glucose, UA: NEGATIVE mg/dL
Hgb urine dipstick: NEGATIVE
Ketones, ur: NEGATIVE mg/dL
Nitrite: NEGATIVE
Protein, ur: NEGATIVE mg/dL
Specific Gravity, Urine: 1.012 (ref 1.005–1.030)
pH: 7 (ref 5.0–8.0)

## 2019-06-15 LAB — WET PREP, GENITAL
Clue Cells Wet Prep HPF POC: NONE SEEN
Sperm: NONE SEEN
Trich, Wet Prep: NONE SEEN
WBC, Wet Prep HPF POC: NONE SEEN
Yeast Wet Prep HPF POC: NONE SEEN

## 2019-06-15 LAB — CBC
HCT: 36.1 % (ref 36.0–46.0)
Hemoglobin: 11.9 g/dL — ABNORMAL LOW (ref 12.0–15.0)
MCH: 26.8 pg (ref 26.0–34.0)
MCHC: 33 g/dL (ref 30.0–36.0)
MCV: 81.3 fL (ref 80.0–100.0)
Platelets: 340 10*3/uL (ref 150–400)
RBC: 4.44 MIL/uL (ref 3.87–5.11)
RDW: 13.1 % (ref 11.5–15.5)
WBC: 7.7 10*3/uL (ref 4.0–10.5)
nRBC: 0 % (ref 0.0–0.2)

## 2019-06-15 LAB — HCG, QUANTITATIVE, PREGNANCY: hCG, Beta Chain, Quant, S: 14826 m[IU]/mL — ABNORMAL HIGH (ref <5)

## 2019-06-15 NOTE — MAU Provider Note (Signed)
Chief Complaint: Breast lump (right breast) and Abdominal Pain   First Provider Initiated Contact with Patient 06/15/19 1059        SUBJECTIVE HPI: Suzanne Flores is a 24 y.o. G2P1001 at [redacted]w[redacted]d by LMP who presents to maternity admissions reporting breast lump on right breast just above areola.  Noticed it last night and states the pain kept her up all night.  Also having ongoing pelvic pain   Was seen in Urgent Care and at office for this.  Urgent care did an HCG but no pelvic or [place].  Dr Mora Appl did an [place] and saw "only the uterus" with no gestational sac. . She denies vaginal bleeding, vaginal itching/burning, urinary symptoms, h/a, dizziness, n/v, or fever/chills.    Abdominal Pain This is a recurrent problem. The current episode started 1 to 4 weeks ago. The problem occurs intermittently. The problem has been unchanged. The pain is located in the suprapubic region and LLQ. The pain is mild. The quality of the pain is cramping. The abdominal pain does not radiate. Pertinent negatives include no constipation, diarrhea, dysuria, fever, frequency, headaches, myalgias, nausea or vomiting. Nothing aggravates the pain. The pain is relieved by nothing. She has tried nothing for the symptoms.   RN Note: Suzanne Flores is a 24 y.o. at [redacted]w[redacted]d here in [facility]U reporting: last night while doing a self breast exam she felt a lump on her right breast. Is also still having abdominal pain since previous [facility] visit. No bleeding Onset of complaint: found lump last night, abdominal pain ongoing Pain score: 9/10  Past Medical History:  Diagnosis Date  . Anxiety    Past Surgical History:  Procedure Laterality Date  . CESAREAN SECTION N/A 02/17/2017   Procedure: CESAREAN SECTION;  Surgeon: Myna Hidalgo, DO;  Location: [facility];  Service: Obstetrics;  Laterality: N/A;  . WISDOM TOOTH EXTRACTION     Social History   Socioeconomic History  . Marital status: Single    Spouse name: Not on file  . Number of children:  Not on file  . Years of education: Not on file  . Highest education level: Not on file  Occupational History  . Not on file  Tobacco Use  . Smoking status: Never Smoker  . Smokeless tobacco: Never Used  Substance and Sexual Activity  . Alcohol use: No  . Drug use: No  . Sexual activity: Yes    Birth control/protection: None  Other Topics Concern  . Not on file  Social History Narrative  . Not on file   Social Determinants of Health   Financial Resource Strain:   . Difficulty of Paying Living Expenses:   Food Insecurity:   . Worried About Programme researcher, broadcasting/film/video in the Last Year:   . Barista in the Last Year:   Transportation Needs:   . Freight forwarder (Medical):   Marland Kitchen Lack of Transportation (Non-Medical):   Physical Activity:   . Days of Exercise per Week:   . Minutes of Exercise per Session:   Stress:   . Feeling of Stress :   Social Connections:   . Frequency of Communication with Friends and Family:   . Frequency of Social Gatherings with Friends and Family:   . Attends Religious Services:   . Active Member of Clubs or Organizations:   . Attends Banker Meetings:   Marland Kitchen Marital Status:   Intimate Partner Violence:   . Fear of Current or Ex-Partner:   . Emotionally  Abused:   Marland Kitchen Physically Abused:   . Sexually Abused:    No current facility-administered medications on file prior to encounter.   Current Outpatient Medications on File Prior to Encounter  Medication Sig Dispense Refill  . acetaminophen (TYLENOL) 500 MG tablet Take 500 mg by mouth every 6 (six) hours as needed for mild pain, moderate pain or headache.    . ibuprofen (ADVIL) 200 MG tablet Take 200 mg by mouth every 6 (six) hours as needed for headache, mild pain or moderate pain.    Marland Kitchen ibuprofen (ADVIL,MOTRIN) 600 MG tablet Take 1 tablet (600 mg total) by mouth every 6 (six) hours as needed. (Patient not taking: Reported on 06/07/2019) 30 tablet 1  . polyethylene glycol powder  (GLYCOLAX/MIRALAX) powder Take 17 g by mouth daily as needed. (Patient not taking: Reported on 06/14/2016) 500 g 1  . Prenatal Vit-Fe Fumarate-FA (PRENATAL COMPLETE) 14-0.4 MG TABS Take 1 tablet daily 60 tablet 3   Allergies  Allergen Reactions  . Tape Other (See Comments)    Unknown  . Avocado Rash  . Cinnamon Rash    I have reviewed patient's Past Medical Hx, Surgical Hx, Family Hx, Social Hx, medications and allergies.   ROS:  Review of Systems  Constitutional: Negative for fever.  Gastrointestinal: Positive for abdominal pain. Negative for constipation, diarrhea, nausea and vomiting.  Genitourinary: Negative for dysuria and frequency.  Musculoskeletal: Negative for myalgias.  Neurological: Negative for headaches.   Review of Systems  Other systems negative   Physical Exam  Physical Exam Chest:      Patient Vitals for the past 24 hrs:  BP Temp Temp src Pulse Resp SpO2 Height Weight  06/15/19 1024 113/69 98.7 F (37.1 C) Oral (!) 109 16 100 % -- --  06/15/19 1020 -- -- -- -- -- -- 5' 3.5" (1.613 m) 77.3 kg   Constitutional: Well-developed, well-nourished female in no acute distress.  Cardiovascular: normal rate Respiratory: normal effort Breasts:  1-2cm round mobile tender mass on right breast (see diagram) GI: Abd soft, non-tender. Pos BS x 4 MS: Extremities nontender, no edema, normal ROM Neurologic: Alert and oriented x 4.  GU: Neg CVAT.  PELVIC EXAM: Cervix pink, visually closed, without lesion, scant white creamy discharge, vaginal walls and external genitalia normal Bimanual exam: Cervix 0/long/high, firm, anterior, neg CMT, uterus nontender, nonenlarged, adnexa without tenderness, enlargement, or mass  LAB RESULTS Results for orders placed or performed during the hospital encounter of 06/15/19 (from the past 24 hour(s))  Urinalysis, Routine w reflex microscopic     Status: Abnormal   Collection Time: 06/15/19 10:54 AM  Result Value Ref Range   Color,  Urine STRAW (A) YELLOW   APPearance CLEAR CLEAR   Specific Gravity, Urine 1.012 1.005 - 1.030   pH 7.0 5.0 - 8.0   Glucose, UA NEGATIVE NEGATIVE mg/dL   Hgb urine dipstick NEGATIVE NEGATIVE   Bilirubin Urine NEGATIVE NEGATIVE   Ketones, ur NEGATIVE NEGATIVE mg/dL   Protein, ur NEGATIVE NEGATIVE mg/dL   Nitrite NEGATIVE NEGATIVE   Leukocytes,Ua TRACE (A) NEGATIVE   RBC / HPF 0-5 0 - 5 RBC/hpf   WBC, UA 0-5 0 - 5 WBC/hpf   Bacteria, UA NONE SEEN NONE SEEN   Squamous Epithelial / LPF 0-5 0 - 5  hCG, quantitative, pregnancy     Status: Abnormal   Collection Time: 06/15/19 11:15 AM  Result Value Ref Range   hCG, Beta Chain, Quant, S 14,826 (H) <5 mIU/mL  CBC  Status: Abnormal   Collection Time: 06/15/19 11:15 AM  Result Value Ref Range   WBC 7.7 4.0 - 10.5 K/uL   RBC 4.44 3.87 - 5.11 MIL/uL   Hemoglobin 11.9 (L) 12.0 - 15.0 g/dL   HCT 36.1 36.0 - 46.0 %   MCV 81.3 80.0 - 100.0 fL   MCH 26.8 26.0 - 34.0 pg   MCHC 33.0 30.0 - 36.0 g/dL   RDW 13.1 11.5 - 15.5 %   Platelets 340 150 - 400 K/uL   nRBC 0.0 0.0 - 0.2 %  Wet prep, genital     Status: None   Collection Time: 06/15/19 11:19 AM   Specimen: Cervix  Result Value Ref Range   Yeast Wet Prep HPF POC NONE SEEN NONE SEEN   Trich, Wet Prep NONE SEEN NONE SEEN   Clue Cells Wet Prep HPF POC NONE SEEN NONE SEEN   WBC, Wet Prep HPF POC NONE SEEN NONE SEEN   Sperm NONE SEEN      IMAGING US OB Comp Less 14 Wks  Result Date: 06/15/2019 CLINICAL DATA:  Pregnant patient. Pregnancy of unknown location. Pelvic pain. EXAM: OBSTETRIC <14 WK [place] AND TRANSVAGINAL OB US TECHNIQUE: Both transabdominal and transvaginal ultrasound examinations were performed for complete evaluation of the gestation as well as the maternal uterus, adnexal regions, and pelvic cul-de-sac. Transvaginal technique was performed to assess early pregnancy. COMPARISON:  None. FINDINGS: Intrauterine gestational sac: Single Yolk sac:  Visualized. Embryo:  Not Visualized.  Cardiac Activity: Not Visualized. MSD: 10.8 mm   5 w   5 d Subchorionic hemorrhage:  Small Maternal uterus/adnexae: Normal right and left ovaries. Trace free fluid in the pelvis. IMPRESSION: Early intrauterine gestational sac and yolk sac but no definite fetal pole or cardiac activity yet visualized. Recommend follow-up quantitative B-HCG levels and follow-up US in 14 days to assess viability. This recommendation follows SRU consensus guidelines: Diagnostic Criteria for Nonviable Pregnancy Early in the First Trimester. [employer] Med 2013; 761:6073-71. Electronically Signed   By: Lovey Newcomer M.D.   On: 06/15/2019 12:44   US OB Transvaginal  Result Date: 06/15/2019 CLINICAL DATA:  Pregnant patient. Pregnancy of unknown location. Pelvic pain. EXAM: OBSTETRIC <14 WK [place] AND TRANSVAGINAL OB US TECHNIQUE: Both transabdominal and transvaginal ultrasound examinations were performed for complete evaluation of the gestation as well as the maternal uterus, adnexal regions, and pelvic cul-de-sac. Transvaginal technique was performed to assess early pregnancy. COMPARISON:  None. FINDINGS: Intrauterine gestational sac: Single Yolk sac:  Visualized. Embryo:  Not Visualized. Cardiac Activity: Not Visualized. MSD: 10.8 mm   5 w   5 d Subchorionic hemorrhage:  Small Maternal uterus/adnexae: Normal right and left ovaries. Trace free fluid in the pelvis. IMPRESSION: Early intrauterine gestational sac and yolk sac but no definite fetal pole or cardiac activity yet visualized. Recommend follow-up quantitative B-HCG levels and follow-up US in 14 days to assess viability. This recommendation follows SRU consensus guidelines: Diagnostic Criteria for Nonviable Pregnancy Early in the First Trimester. [employer] Med 2013; 062:6948-54. Electronically Signed   By: Lovey Newcomer M.D.   On: 06/15/2019 12:44   MAU Management/MDM: Ordered usual first trimester r/o ectopic labs. Initial HCG level on 06/07/19 was 979  Pelvic exam and cultures  done Will check followup Ultrasound to rule out ectopic.  This bleeding/pain can represent a normal pregnancy with bleeding, spontaneous abortion or even an ectopic which can be life-threatening.  The process as listed above helps to determine which of these  is present.  [place] now shows Single gestational sac with yolk sac which effectively rules out ectopic pregnancy.  Reviewed UA is clear and it is unclear why she is having pelvic cramping.  Possibly uterus cramping or bowel Will order [place] and have pt call [facility] Monday to set up schedule  ASSESSMENT Single IUP at [redacted]w[redacted]d Right breast mass Pelvic pain in early pregnancy Pregnancy of unknown location [place] IUGS with Yolk Sac seen  PLAN Discharge home Pt to call and schedule breast ultrasound with [facility] Followup with office as scheduled  Pt stable at time of discharge. Encouraged to return here or to other Urgent Care/ED if she develops worsening of symptoms, increase in pain, fever, or other concerning symptoms.    Wynelle Bourgeois CNM, MSN Certified Nurse-Midwife 06/15/2019  10:59 AM

## 2019-06-15 NOTE — MAU Note (Signed)
Suzanne Flores is a 24 y.o. at [redacted]w[redacted]d here in MAU reporting: last night while doing a self breast exam she felt a lump on her right breast. Is also still having abdominal pain since previous [facility] visit. No bleeding.  Onset of complaint: found lump last night, abdominal pain ongoing  Pain score: 9/10  Vitals:   06/15/19 1024  BP: 113/69  Pulse: (!) 109  Resp: 16  Temp: 98.7 F (37.1 C)  SpO2: 100%     Lab orders placed from triage: UA

## 2019-06-15 NOTE — Discharge Instructions (Signed)
Breast Cyst  A breast cyst is a sac in the breast that is filled with fluid. They are usually noncancerous (benign) and are common among women. Breast cysts are most often in the upper, outer portion of the breast. One or more cysts may develop. They form when fluid builds up inside the breast glands. There are several types of breast cysts. Some are too small to feel, but these can be seen with imaging tests such as an X-ray of the breast (mammogram) or ultrasound. Breast cysts do not increase your risk of breast cancer. They usually disappear after you no longer have a menstrual cycle (after menopause), unless you take artificial hormones (are on hormone therapy). What are the causes? This condition may be caused by:  Blockage of tubes (ducts) in the breast glands, which leads to fluid buildup. Duct blockage may result from: ? Fibrocystic breast changes. This is a common, benign condition that occurs when women go through hormonal changes during the menstrual cycle. This is a common cause of multiple breast cysts. ? Overgrowth of breast tissue or breast glands. ? Scar tissue in the breast from previous surgery.  Changes in certain female hormones (estrogen and progesterone). The exact cause of this condition is not known. What increases the risk? You may be more likely to develop breast cysts if you have not gone through menopause. What are the signs or symptoms? Symptoms of this condition include:  Feeling one or more smooth, round, soft lumps (like grapes) in the breast that are easily movable. The lump or lumps may get bigger and more painful before your menstrual period and get smaller after your menstrual period.  Breast discomfort or pain. How is this diagnosed? This condition may be diagnosed based on:  A physical exam. A cyst can be felt by your health care provider during this exam.  Imaging tests, such as mammogram or ultrasound. Fluid may be removed from the cyst with a  needle (fine-needle aspiration) and tested to make sure the cyst is not cancerous. How is this treated? Treatment may not be needed for this condition. Your health care provider may monitor the cyst to see if it goes away on its own. If the cyst is uncomfortable or gets bigger, or if you do not like how the cyst makes your breast look, you may need treatment. Treatment may include:  Hormone therapy.  Fine-needle aspiration to drain fluid from the cyst. There is a chance of the cyst coming back (recurring) after aspiration.  Surgery to remove the cyst. Follow these instructions at home: Self-exams   Do a breast self-exam every month, or as often as directed. A breast self-exam involves: ? Comparing your breasts in the mirror. ? Looking for visible changes in your skin or nipples. ? Feeling for lumps or changes.  Having many breast cysts may make it harder to feel for new lumps. Understand how your breasts normally look and feel, and write down any changes in your breasts. Tell your health care provider about any changes. Eating and drinking  Follow instructions from your health care provider about eating and drinking restrictions.  Drink enough fluid to keep your urine pale yellow.  Avoid caffeine.  Cut down on salt (sodium) in what you eat and drink, especially before your menstrual period. Too much sodium can cause fluid buildup, breast swelling, and discomfort. General instructions  See your health care provider regularly. ? Get a yearly physical exam. ? If you are 4420-23 years old, get a  clinical breast exam every 1-3 years. After the age of 40 years, get this exam every year. ? Get mammograms as often as directed.  Take over-the-counter and prescription medicines only as told by your health care provider.  Wear a supportive bra, especially when exercising.  Keep all follow-up visits as told by your health care provider. This is important. Contact a health care provider  if:  You feel, or think you feel, a lump in your breast.  You notice that both breasts look or feel different than usual.  Your breast is still causing pain after your menstrual period is over.  You find new lumps or bumps that were not there before.  You feel lumps in your armpit. Get help right away if:  You have severe pain, tenderness, redness, or warmth in your breast.  You have fluid or blood leaking from your nipple.  Your breast lump becomes hard and painful.  You notice dimpling or wrinkling of the breast or nipple. Summary  A breast cyst is a sac in the breast that is filled with fluid.  Treatment may not be needed for this condition.  If the cyst is uncomfortable or gets bigger, or if you do not like how the cyst makes your breast look, you may need treatment. This information is not intended to replace advice given to you by your health care provider. Make sure you discuss any questions you have with your health care provider. Document Revised: 07/24/2018 Document Reviewed: 07/24/2018 Elsevier Patient Education  2020 [employer]. First Trimester of Pregnancy The first trimester of pregnancy is from week 1 until the end of week 13 (months 1 through 3). A week after a sperm fertilizes an egg, the egg will implant on the wall of the uterus. This embryo will begin to develop into a baby. Genes from you and your partner will form the baby. The female genes will determine whether the baby will be a boy or a girl. At 6-8 weeks, the eyes and face will be formed, and the heartbeat can be seen on ultrasound. At the end of 12 weeks, all the baby's organs will be formed. Now that you are pregnant, you will want to do everything you can to have a healthy baby. Two of the most important things are to get good prenatal care and to follow your health care provider's instructions. Prenatal care is all the medical care you receive before the baby's birth. This care will help prevent, find,  and treat any problems during the pregnancy and childbirth. Body changes during your first trimester Your body goes through many changes during pregnancy. The changes vary from woman to woman.  You may gain or lose a couple of pounds at first.  You may feel sick to your stomach (nauseous) and you may throw up (vomit). If the vomiting is uncontrollable, call your health care provider.  You may tire easily.  You may develop headaches that can be relieved by medicines. All medicines should be approved by your health care provider.  You may urinate more often. Painful urination may mean you have a bladder infection.  You may develop heartburn as a result of your pregnancy.  You may develop constipation because certain hormones are causing the muscles that push stool through your intestines to slow down.  You may develop hemorrhoids or swollen veins (varicose veins).  Your breasts may begin to grow larger and become tender. Your nipples may stick out more, and the tissue that surrounds them (  areola) may become darker.  Your gums may bleed and may be sensitive to brushing and flossing.  Dark spots or blotches (chloasma, mask of pregnancy) may develop on your face. This will likely fade after the baby is born.  Your menstrual periods will stop.  You may have a loss of appetite.  You may develop cravings for certain kinds of food.  You may have changes in your emotions from day to day, such as being excited to be pregnant or being concerned that something may go wrong with the pregnancy and baby.  You may have more vivid and strange dreams.  You may have changes in your hair. These can include thickening of your hair, rapid growth, and changes in texture. Some women also have hair loss during or after pregnancy, or hair that feels dry or thin. Your hair will most likely return to normal after your baby is born. What to expect at prenatal visits During a routine prenatal visit:  You  will be weighed to make sure you and the baby are growing normally.  Your blood pressure will be taken.  Your abdomen will be measured to track your baby's growth.  The fetal heartbeat will be listened to between weeks 10 and 14 of your pregnancy.  Test results from any previous visits will be discussed. Your health care provider may ask you:  How you are feeling.  If you are feeling the baby move.  If you have had any abnormal symptoms, such as leaking fluid, bleeding, severe headaches, or abdominal cramping.  If you are using any tobacco products, including cigarettes, chewing tobacco, and electronic cigarettes.  If you have any questions. Other tests that may be performed during your first trimester include:  Blood tests to find your blood type and to check for the presence of any previous infections. The tests will also be used to check for low iron levels (anemia) and protein on red blood cells (Rh antibodies). Depending on your risk factors, or if you previously had diabetes during pregnancy, you may have tests to check for high blood sugar that affects pregnant women (gestational diabetes).  Urine tests to check for infections, diabetes, or protein in the urine.  An ultrasound to confirm the proper growth and development of the baby.  Fetal screens for spinal cord problems (spina bifida) and Down syndrome.  HIV (human immunodeficiency virus) testing. Routine prenatal testing includes screening for HIV, unless you choose not to have this test.  You may need other tests to make sure you and the baby are doing well. Follow these instructions at home: Medicines  Follow your health care provider's instructions regarding medicine use. Specific medicines may be either safe or unsafe to take during pregnancy.  Take a prenatal vitamin that contains at least 600 micrograms (mcg) of folic acid.  If you develop constipation, try taking a stool softener if your health care provider  approves. Eating and drinking   Eat a balanced diet that includes fresh fruits and vegetables, whole grains, good sources of protein such as meat, eggs, or tofu, and low-fat dairy. Your health care provider will help you determine the amount of weight gain that is right for you.  Avoid raw meat and uncooked cheese. These carry germs that can cause birth defects in the baby.  Eating four or five small meals rather than three large meals a day may help relieve nausea and vomiting. If you start to feel nauseous, eating a few soda crackers can be helpful.  Drinking liquids between meals, instead of during meals, also seems to help ease nausea and vomiting.  Limit foods that are high in fat and processed sugars, such as fried and sweet foods.  To prevent constipation: ? Eat foods that are high in fiber, such as fresh fruits and vegetables, whole grains, and beans. ? Drink enough fluid to keep your urine clear or pale yellow. Activity  Exercise only as directed by your health care provider. Most women can continue their usual exercise routine during pregnancy. Try to exercise for 30 minutes at least 5 days a week. Exercising will help you: ? Control your weight. ? Stay in shape. ? Be prepared for labor and delivery.  Experiencing pain or cramping in the lower abdomen or lower back is a good sign that you should stop exercising. Check with your health care provider before continuing with normal exercises.  Try to avoid standing for long periods of time. Move your legs often if you must stand in one place for a long time.  Avoid heavy lifting.  Wear low-heeled shoes and practice good posture.  You may continue to have sex unless your health care provider tells you not to. Relieving pain and discomfort  Wear a good support bra to relieve breast tenderness.  Take warm sitz baths to soothe any pain or discomfort caused by hemorrhoids. Use hemorrhoid cream if your health care provider  approves.  Rest with your legs elevated if you have leg cramps or low back pain.  If you develop varicose veins in your legs, wear support hose. Elevate your feet for 15 minutes, 3-4 times a day. Limit salt in your diet. Prenatal care  Schedule your prenatal visits by the twelfth week of pregnancy. They are usually scheduled monthly at first, then more often in the last 2 months before delivery.  Write down your questions. Take them to your prenatal visits.  Keep all your prenatal visits as told by your health care provider. This is important. Safety  Wear your seat belt at all times when driving.  Make a list of emergency phone numbers, including numbers for family, friends, the hospital, and police and fire departments. General instructions  Ask your health care provider for a referral to a local prenatal education class. Begin classes no later than the beginning of month 6 of your pregnancy.  Ask for help if you have counseling or nutritional needs during pregnancy. Your health care provider can offer advice or refer you to specialists for help with various needs.  Do not use hot tubs, steam rooms, or saunas.  Do not douche or use tampons or scented sanitary pads.  Do not cross your legs for long periods of time.  Avoid cat litter boxes and soil used by cats. These carry germs that can cause birth defects in the baby and possibly loss of the fetus by miscarriage or stillbirth.  Avoid all smoking, herbs, alcohol, and medicines not prescribed by your health care provider. Chemicals in these products affect the formation and growth of the baby.  Do not use any products that contain nicotine or tobacco, such as cigarettes and e-cigarettes. If you need help quitting, ask your health care provider. You may receive counseling support and other resources to help you quit.  Schedule a dentist appointment. At home, brush your teeth with a soft toothbrush and be gentle when you  floss. Contact a health care provider if:  You have dizziness.  You have mild pelvic cramps, pelvic pressure, or  nagging pain in the abdominal area.  You have persistent nausea, vomiting, or diarrhea.  You have a bad smelling vaginal discharge.  You have pain when you urinate.  You notice increased swelling in your face, hands, legs, or ankles.  You are exposed to fifth disease or chickenpox.  You are exposed to [place] measles (rubella) and have never had it. Get help right away if:  You have a fever.  You are leaking fluid from your vagina.  You have spotting or bleeding from your vagina.  You have severe abdominal cramping or pain.  You have rapid weight gain or loss.  You vomit blood or material that looks like coffee grounds.  You develop a severe headache.  You have shortness of breath.  You have any kind of trauma, such as from a fall or a car accident. Summary  The first trimester of pregnancy is from week 1 until the end of week 13 (months 1 through 3).  Your body goes through many changes during pregnancy. The changes vary from woman to woman.  You will have routine prenatal visits. During those visits, your health care provider will examine you, discuss any test results you may have, and talk with you about how you are feeling. This information is not intended to replace advice given to you by your health care provider. Make sure you discuss any questions you have with your health care provider. Document Revised: 02/17/2017 Document Reviewed: 02/17/2016 Elsevier Patient Education  2020 [employer].

## 2019-06-17 LAB — GC/CHLAMYDIA PROBE AMP (~~LOC~~) NOT AT ARMC
Chlamydia: NEGATIVE
Comment: NEGATIVE
Comment: NORMAL
Neisseria Gonorrhea: NEGATIVE

## 2019-06-27 ENCOUNTER — Other Ambulatory Visit: Payer: Self-pay

## 2019-06-27 ENCOUNTER — Ambulatory Visit
Admission: RE | Admit: 2019-06-27 | Discharge: 2019-06-27 | Disposition: A | Discharge status: Home / Self Care | Payer: Medicaid Other | Source: Ambulatory Visit | Attending: Advanced Practice Midwife | Admitting: Advanced Practice Midwife

## 2019-06-27 ENCOUNTER — Encounter: Payer: Self-pay | Admitting: Advanced Practice Midwife

## 2019-06-27 DIAGNOSIS — N63 Unspecified lump in unspecified breast: Secondary | ICD-10-CM

## 2019-06-27 DIAGNOSIS — N6310 Unspecified lump in the right breast, unspecified quadrant: Secondary | ICD-10-CM | POA: Insufficient documentation

## 2019-08-01 ENCOUNTER — Other Ambulatory Visit: Payer: Self-pay

## 2019-08-01 LAB — OB RESULTS CONSOLE HIV ANTIBODY (ROUTINE TESTING): HIV: NONREACTIVE

## 2019-08-01 LAB — OB RESULTS CONSOLE HEPATITIS B SURFACE ANTIGEN: Hepatitis B Surface Ag: NEGATIVE

## 2019-08-01 LAB — HEPATITIS C ANTIBODY: HCV Ab: NEGATIVE

## 2019-08-01 LAB — OB RESULTS CONSOLE RUBELLA ANTIBODY, IGM: Rubella: IMMUNE

## 2019-08-01 LAB — OB RESULTS CONSOLE RPR: RPR: NONREACTIVE

## 2019-09-16 ENCOUNTER — Other Ambulatory Visit: Payer: Self-pay | Admitting: Obstetrics and Gynecology

## 2019-09-16 DIAGNOSIS — Z3A20 20 weeks gestation of pregnancy: Secondary | ICD-10-CM

## 2019-09-16 DIAGNOSIS — Z363 Encounter for antenatal screening for malformations: Secondary | ICD-10-CM

## 2019-09-30 ENCOUNTER — Encounter (INDEPENDENT_AMBULATORY_CARE_PROVIDER_SITE_OTHER): Payer: Self-pay

## 2019-09-30 ENCOUNTER — Other Ambulatory Visit: Payer: Self-pay

## 2019-09-30 ENCOUNTER — Other Ambulatory Visit: Payer: Self-pay | Admitting: Obstetrics and Gynecology

## 2019-09-30 ENCOUNTER — Other Ambulatory Visit: Payer: Self-pay | Admitting: *Deleted

## 2019-09-30 ENCOUNTER — Ambulatory Visit: Payer: Medicaid Other | Attending: Obstetrics and Gynecology

## 2019-09-30 ENCOUNTER — Ambulatory Visit: Payer: Medicaid Other | Admitting: *Deleted

## 2019-09-30 VITALS — BP 114/66 | HR 97 | Ht 63.0 in

## 2019-09-30 DIAGNOSIS — Z362 Encounter for other antenatal screening follow-up: Secondary | ICD-10-CM

## 2019-09-30 DIAGNOSIS — Z363 Encounter for antenatal screening for malformations: Secondary | ICD-10-CM | POA: Insufficient documentation

## 2019-09-30 DIAGNOSIS — O99212 Obesity complicating pregnancy, second trimester: Secondary | ICD-10-CM

## 2019-09-30 DIAGNOSIS — Z3A20 20 weeks gestation of pregnancy: Secondary | ICD-10-CM

## 2019-09-30 DIAGNOSIS — O34219 Maternal care for unspecified type scar from previous cesarean delivery: Secondary | ICD-10-CM

## 2019-09-30 DIAGNOSIS — O09292 Supervision of pregnancy with other poor reproductive or obstetric history, second trimester: Secondary | ICD-10-CM

## 2019-09-30 DIAGNOSIS — E669 Obesity, unspecified: Secondary | ICD-10-CM

## 2019-10-28 ENCOUNTER — Encounter: Payer: Self-pay | Admitting: *Deleted

## 2019-10-28 ENCOUNTER — Ambulatory Visit: Payer: Medicaid Other | Admitting: *Deleted

## 2019-10-28 ENCOUNTER — Other Ambulatory Visit: Payer: Self-pay

## 2019-10-28 ENCOUNTER — Ambulatory Visit: Payer: Medicaid Other | Attending: Obstetrics and Gynecology

## 2019-10-28 VITALS — BP 124/63 | HR 97

## 2019-10-28 DIAGNOSIS — O99212 Obesity complicating pregnancy, second trimester: Secondary | ICD-10-CM

## 2019-10-28 DIAGNOSIS — E669 Obesity, unspecified: Secondary | ICD-10-CM

## 2019-10-28 DIAGNOSIS — O34219 Maternal care for unspecified type scar from previous cesarean delivery: Secondary | ICD-10-CM | POA: Diagnosis present

## 2019-10-28 DIAGNOSIS — Z3A24 24 weeks gestation of pregnancy: Secondary | ICD-10-CM

## 2019-10-28 DIAGNOSIS — O09292 Supervision of pregnancy with other poor reproductive or obstetric history, second trimester: Secondary | ICD-10-CM | POA: Diagnosis not present

## 2019-10-28 DIAGNOSIS — Z362 Encounter for other antenatal screening follow-up: Secondary | ICD-10-CM | POA: Diagnosis present

## 2019-11-21 ENCOUNTER — Inpatient Hospital Stay (HOSPITAL_COMMUNITY)
Admission: AD | Admit: 2019-11-21 | Discharge: 2019-11-21 | Disposition: A | Discharge status: Home / Self Care | Payer: Medicaid Other | Attending: Obstetrics & Gynecology | Admitting: Obstetrics & Gynecology

## 2019-11-21 ENCOUNTER — Other Ambulatory Visit: Payer: Self-pay

## 2019-11-21 ENCOUNTER — Encounter (HOSPITAL_COMMUNITY): Payer: Self-pay | Admitting: Obstetrics & Gynecology

## 2019-11-21 DIAGNOSIS — Z3A27 27 weeks gestation of pregnancy: Secondary | ICD-10-CM

## 2019-11-21 DIAGNOSIS — Z7982 Long term (current) use of aspirin: Secondary | ICD-10-CM | POA: Diagnosis not present

## 2019-11-21 DIAGNOSIS — R109 Unspecified abdominal pain: Secondary | ICD-10-CM | POA: Diagnosis not present

## 2019-11-21 DIAGNOSIS — O26892 Other specified pregnancy related conditions, second trimester: Secondary | ICD-10-CM

## 2019-11-21 LAB — URINALYSIS, ROUTINE W REFLEX MICROSCOPIC
Bilirubin Urine: NEGATIVE
Glucose, UA: NEGATIVE mg/dL
Hgb urine dipstick: NEGATIVE
Ketones, ur: NEGATIVE mg/dL
Nitrite: NEGATIVE
Protein, ur: NEGATIVE mg/dL
Specific Gravity, Urine: 1.014 (ref 1.005–1.030)
pH: 7 (ref 5.0–8.0)

## 2019-11-21 LAB — WET PREP, GENITAL
Clue Cells Wet Prep HPF POC: NONE SEEN
Sperm: NONE SEEN
Trich, Wet Prep: NONE SEEN
Yeast Wet Prep HPF POC: NONE SEEN

## 2019-11-21 NOTE — Discharge Instructions (Signed)
Abdominal Pain During Pregnancy  Abdominal pain is common during pregnancy, and has many possible causes. Some causes are more serious than others, and sometimes the cause is not known. Abdominal pain can be a sign that labor is starting. It can also be caused by normal growth and stretching of muscles and ligaments during pregnancy. Always tell your health care provider if you have any abdominal pain. Follow these instructions at home:  Do not have sex or put anything in your vagina until your pain goes away completely.  Get plenty of rest until your pain improves.  Drink enough fluid to keep your urine pale yellow.  Take over-the-counter and prescription medicines only as told by your health care provider.  Keep all follow-up visits as told by your health care provider. This is important. Contact a health care provider if:  Your pain continues or gets worse after resting.  You have lower abdominal pain that: ? Comes and goes at regular intervals. ? Spreads to your back. ? Is similar to menstrual cramps.  You have pain or burning when you urinate. Get help right away if:  You have a fever or chills.  You have vaginal bleeding.  You are leaking fluid from your vagina.  You are passing tissue from your vagina.  You have vomiting or diarrhea that lasts for more than 24 hours.  Your baby is moving less than usual.  You feel very weak or faint.  You have shortness of breath.  You develop severe pain in your upper abdomen. Summary  Abdominal pain is common during pregnancy, and has many possible causes.  If you experience abdominal pain during pregnancy, tell your health care provider right away.  Follow your health care provider's home care instructions and keep all follow-up visits as directed. This information is not intended to replace advice given to you by your health care provider. Make sure you discuss any questions you have with your health care  provider. Document Revised: 06/25/2018 Document Reviewed: 06/09/2016 Elsevier Patient Education  2020 [employer]vier Inc[employer]  

## 2019-11-21 NOTE — MAU Note (Signed)
.   Suzanne Flores is a 24 y.o. at [redacted]w[redacted]d here in MAU reporting: pain in her upper and lower abdomen that started this morning when she woke up. Denies any VB or LOF. +FM  Onset of complaint: this morning Pain score: 10 Vitals:   11/21/19 1336  BP: 121/72  Pulse: (!) 115  Resp: 15  Temp: 98.6 F (37 C)  SpO2: 100%     FHT:150 Lab orders placed from triage: UA

## 2019-11-21 NOTE — MAU Provider Note (Signed)
History     CSN: [id number]  Arrival date and time: 11/21/19 1327   First Provider Initiated Contact with Patient 11/21/19 1408      Chief Complaint  Patient presents with  . Abdominal Pain   23 y.o. G2P1001 @27 .6 wks presenting with generalized abdominal pain. Pain started around 10am. Pain is worse on left side. Describes as intermittent and achy. Rates pain 10/10. She took Tylenol but it didn't help. Denies ctx, VB, or LOF. +FM. She is eating and drinking but has some nausea. Denies fevers. Denies urinary sx. Hx of constipation.     OB History    Gravida  2   Para  1   Term  1   Preterm      AB      Living  1     SAB      TAB      Ectopic      Multiple  0   Live Births  1           Past Medical History:  Diagnosis Date  . Anxiety   . Pregnancy induced hypertension     Past Surgical History:  Procedure Laterality Date  . CESAREAN SECTION N/A 02/17/2017   Procedure: CESAREAN SECTION;  Surgeon: 02/19/2017, DO;  Location: [facility];  Service: Obstetrics;  Laterality: N/A;  . WISDOM TOOTH EXTRACTION      Family History  Problem Relation Age of Onset  . Thyroid disease Mother     Social History   Tobacco Use  . Smoking status: Never Smoker  . Smokeless tobacco: Never Used  Vaping Use  . Vaping Use: Never used  Substance Use Topics  . Alcohol use: No  . Drug use: No    Allergies:  Allergies  Allergen Reactions  . Tape Other (See Comments)    Unknown  . Avocado Rash  . Cinnamon Rash    Medications Prior to Admission  Medication Sig Dispense Refill Last Dose  . acetaminophen (TYLENOL) 500 MG tablet Take 500 mg by mouth every 6 (six) hours as needed for mild pain, moderate pain or headache.   Past Month at Unknown time  . aspirin 81 MG chewable tablet Chew by mouth daily.   11/21/2019 at Unknown time  . Prenatal Vit-Fe Fumarate-FA (PRENATAL COMPLETE) 14-0.4 MG TABS Take 1 tablet daily 60 tablet 3 11/21/2019 at Unknown time  .  polyethylene glycol powder (GLYCOLAX/MIRALAX) powder Take 17 g by mouth daily as needed. (Patient not taking: Reported on 06/14/2016) 500 g 1     Review of Systems  Constitutional: Negative for chills and fever.  Gastrointestinal: Positive for abdominal pain and constipation.  Genitourinary: Negative for dysuria, hematuria, urgency, vaginal bleeding and vaginal discharge.  Musculoskeletal: Positive for back pain.   Physical Exam   Blood pressure 121/72, pulse (!) 115, temperature 98.6 F (37 C), resp. rate 15, height 5\' 3"  (1.6 m), weight 88.9 kg, last menstrual period 05/10/2019, SpO2 100 %, unknown if currently breastfeeding.  Physical Exam Vitals and nursing note reviewed. Exam conducted with a chaperone present.  Constitutional:      General: She is not in acute distress (appears comfortable).    Appearance: Normal appearance. She is well-developed.  HENT:     Head: Normocephalic and atraumatic.  Pulmonary:     Effort: Pulmonary effort is normal. No respiratory distress.  Abdominal:     Palpations: Abdomen is soft.     Tenderness: There is no abdominal tenderness. There is  no right CVA tenderness or left CVA tenderness.  Genitourinary:    Comments: VE: closed/thick Musculoskeletal:        General: Normal range of motion.     Cervical back: Normal and normal range of motion.     Thoracic back: Normal.     Lumbar back: Tenderness present.  Skin:    General: Skin is warm and dry.  Neurological:     General: No focal deficit present.     Mental Status: She is alert and oriented to person, place, and time.  Psychiatric:        Mood and Affect: Mood normal.   EFM: 150 bpm, mod variability, + accels, no decels Toco: UI  Results for orders placed or performed during the hospital encounter of 11/21/19 (from the past 24 hour(s))  Wet prep, genital     Status: Abnormal   Collection Time: 11/21/19  2:18 PM   Specimen: PATH Cytology Cervicovaginal Ancillary Only  Result Value  Ref Range   Yeast Wet Prep HPF POC NONE SEEN NONE SEEN   Trich, Wet Prep NONE SEEN NONE SEEN   Clue Cells Wet Prep HPF POC NONE SEEN NONE SEEN   WBC, Wet Prep HPF POC MANY (A) NONE SEEN   Sperm NONE SEEN   Urinalysis, Routine w reflex microscopic Urine, Clean Catch     Status: Abnormal   Collection Time: 11/21/19  2:27 PM  Result Value Ref Range   Color, Urine YELLOW YELLOW   APPearance HAZY (A) CLEAR   Specific Gravity, Urine 1.014 1.005 - 1.030   pH 7.0 5.0 - 8.0   Glucose, UA NEGATIVE NEGATIVE mg/dL   Hgb urine dipstick NEGATIVE NEGATIVE   Bilirubin Urine NEGATIVE NEGATIVE   Ketones, ur NEGATIVE NEGATIVE mg/dL   Protein, ur NEGATIVE NEGATIVE mg/dL   Nitrite NEGATIVE NEGATIVE   Leukocytes,Ua MODERATE (A) NEGATIVE   RBC / HPF 0-5 0 - 5 RBC/hpf   WBC, UA 0-5 0 - 5 WBC/hpf   Bacteria, UA RARE (A) NONE SEEN   Squamous Epithelial / LPF 6-10 0 - 5   MAU Course  Procedures  MDM Labs ordered and reviewed. No signs of PTL, UTI, or acute abd process. Pain likely MSK, discussed comfort measures. Stable for discharge home.   Assessment and Plan   1. [redacted] weeks gestation of pregnancy   2. Abdominal pain during pregnancy in second trimester    Discharge home Follow up at [facility] as scheduled PTL precautions  Allergies as of 11/21/2019      Reactions   Tape Other (See Comments)   Unknown   Avocado Rash   Cinnamon Rash      Medication List    TAKE these medications   acetaminophen 500 MG tablet Commonly known as: TYLENOL Take 500 mg by mouth every 6 (six) hours as needed for mild pain, moderate pain or headache.   aspirin 81 MG chewable tablet Chew by mouth daily.   polyethylene glycol powder 17 GM/SCOOP powder Commonly known as: GLYCOLAX/MIRALAX Take 17 g by mouth daily as needed.   Prenatal Complete 14-0.4 MG Tabs Take 1 tablet daily      Donette Larry, CNM 11/21/2019, 3:41 PM

## 2019-11-22 LAB — GC/CHLAMYDIA PROBE AMP (~~LOC~~) NOT AT ARMC
Chlamydia: NEGATIVE
Comment: NEGATIVE
Comment: NORMAL
Neisseria Gonorrhea: NEGATIVE

## 2019-12-30 ENCOUNTER — Other Ambulatory Visit: Payer: Self-pay | Admitting: Obstetrics and Gynecology

## 2020-01-13 LAB — OB RESULTS CONSOLE GBS
GBS: NEGATIVE
GBS: NEGATIVE

## 2020-01-27 ENCOUNTER — Encounter (HOSPITAL_COMMUNITY): Payer: Self-pay

## 2020-01-27 NOTE — Patient Instructions (Signed)
Issis Lindseth  01/27/2020   Your procedure is scheduled on:  02/07/2020  Arrive at 0800 at Entrance C on [employer] at [facility]  and [employer]. You are invited to use the FREE valet parking or use the Visitor's parking deck.  Pick up the phone at the desk and dial [phone].  Call this number if you have problems the morning of surgery: [phone]  Remember:   Do not eat food:(After Midnight) Desps de medianoche.  Do not drink clear liquids: (After Midnight) Desps de medianoche.  Take these medicines the morning of surgery with A SIP OF WATER:  none   Do not wear jewelry, make-up or nail polish.  Do not wear lotions, powders, or perfumes. Do not wear deodorant.  Do not shave 48 hours prior to surgery.  Do not bring valuables to the hospital.  [facility] is not   responsible for any belongings or valuables brought to the hospital.  Contacts, dentures or bridgework may not be worn into surgery.  Leave suitcase in the car. After surgery it may be brought to your room.  For patients admitted to the hospital, checkout time is 11:00 AM the day of              discharge.      Please read over the following fact sheets that you were given:     Preparing for Surgery

## 2020-02-01 ENCOUNTER — Inpatient Hospital Stay (INDEPENDENT_AMBULATORY_CARE_PROVIDER_SITE_OTHER)
Admission: AD | Admit: 2020-02-01 | Discharge: 2020-02-01 | Disposition: A | Discharge status: Home / Self Care | Payer: Medicaid Other | Source: Home / Self Care | Attending: Obstetrics and Gynecology | Admitting: Obstetrics and Gynecology

## 2020-02-01 ENCOUNTER — Inpatient Hospital Stay (HOSPITAL_BASED_OUTPATIENT_CLINIC_OR_DEPARTMENT_OTHER): Payer: Medicaid Other

## 2020-02-01 ENCOUNTER — Other Ambulatory Visit: Payer: Self-pay

## 2020-02-01 ENCOUNTER — Encounter (HOSPITAL_COMMUNITY): Payer: Self-pay | Admitting: Obstetrics and Gynecology

## 2020-02-01 DIAGNOSIS — O99213 Obesity complicating pregnancy, third trimester: Secondary | ICD-10-CM

## 2020-02-01 DIAGNOSIS — O09293 Supervision of pregnancy with other poor reproductive or obstetric history, third trimester: Secondary | ICD-10-CM

## 2020-02-01 DIAGNOSIS — O471 False labor at or after 37 completed weeks of gestation: Secondary | ICD-10-CM

## 2020-02-01 DIAGNOSIS — O368130 Decreased fetal movements, third trimester, not applicable or unspecified: Secondary | ICD-10-CM

## 2020-02-01 DIAGNOSIS — E669 Obesity, unspecified: Secondary | ICD-10-CM

## 2020-02-01 DIAGNOSIS — O34219 Maternal care for unspecified type scar from previous cesarean delivery: Secondary | ICD-10-CM

## 2020-02-01 DIAGNOSIS — Z8759 Personal history of other complications of pregnancy, childbirth and the puerperium: Secondary | ICD-10-CM | POA: Insufficient documentation

## 2020-02-01 DIAGNOSIS — O368190 Decreased fetal movements, unspecified trimester, not applicable or unspecified: Secondary | ICD-10-CM

## 2020-02-01 DIAGNOSIS — Z3689 Encounter for other specified antenatal screening: Secondary | ICD-10-CM

## 2020-02-01 DIAGNOSIS — Z3A38 38 weeks gestation of pregnancy: Secondary | ICD-10-CM

## 2020-02-01 DIAGNOSIS — Z7982 Long term (current) use of aspirin: Secondary | ICD-10-CM | POA: Insufficient documentation

## 2020-02-01 DIAGNOSIS — Z362 Encounter for other antenatal screening follow-up: Secondary | ICD-10-CM

## 2020-02-01 NOTE — MAU Provider Note (Signed)
History     CSN: [id number]  Arrival date and time: 02/01/20 9211   First Provider Initiated Contact with Patient 02/01/20 0857      Chief Complaint  Patient presents with  . Abdominal Pain  . Vaginal Bleeding  . Decreased Fetal Movement   HPI Suzanne Flores is a 24 y.o. G2P1001 at [redacted]w[redacted]d who presents with abdominal pain. She states she woke up at 0600 with intermittent abdominal pain that she rates a 5/10. She denies any leaking of fluid but reports more frequent trips to the bathroom. She reports seeing spotting when she wiped one time but none now. She reports decreased fetal movement since she woke up but has not ate or drank anything since last night.   She is scheduled for repeat c/s on 11/19.   OB History    Gravida  2   Para  1   Term  1   Preterm      AB      Living  1     SAB      TAB      Ectopic      Multiple  0   Live Births  1           Past Medical History:  Diagnosis Date  . Anxiety   . History of pre-eclampsia in prior pregnancy, currently pregnant   . Pregnancy induced hypertension     Past Surgical History:  Procedure Laterality Date  . CESAREAN SECTION N/A 02/17/2017   Procedure: CESAREAN SECTION;  Surgeon: Myna Hidalgo, DO;  Location: [facility];  Service: Obstetrics;  Laterality: N/A;  . WISDOM TOOTH EXTRACTION      Family History  Problem Relation Age of Onset  . Thyroid disease Mother     Social History   Tobacco Use  . Smoking status: Never Smoker  . Smokeless tobacco: Never Used  Vaping Use  . Vaping Use: Never used  Substance Use Topics  . Alcohol use: No  . Drug use: No    Allergies:  Allergies  Allergen Reactions  . Tape Other (See Comments)    Unknown  . Avocado Rash  . Cinnamon Rash    Medications Prior to Admission  Medication Sig Dispense Refill Last Dose  . acetaminophen (TYLENOL) 500 MG tablet Take 500 mg by mouth every 6 (six) hours as needed for mild pain, moderate pain or headache.      Marland Kitchen aspirin 81 MG chewable tablet Chew by mouth daily.     . polyethylene glycol powder (GLYCOLAX/MIRALAX) powder Take 17 g by mouth daily as needed. (Patient not taking: Reported on 06/14/2016) 500 g 1   . Prenatal Vit-Fe Fumarate-FA (PRENATAL COMPLETE) 14-0.4 MG TABS Take 1 tablet daily 60 tablet 3     Review of Systems  Constitutional: Negative.  Negative for fatigue and fever.  HENT: Negative.   Respiratory: Negative.  Negative for shortness of breath.   Cardiovascular: Negative.  Negative for chest pain.  Gastrointestinal: Positive for abdominal pain. Negative for constipation, diarrhea, nausea and vomiting.  Genitourinary: Positive for vaginal bleeding. Negative for dysuria and vaginal discharge.  Neurological: Negative.  Negative for dizziness and headaches.   Physical Exam   Blood pressure 138/74, pulse 94, temperature 98.6 F (37 C), temperature source Oral, resp. rate 16, height 5\' 3"  (1.6 m), weight 96.1 kg, last menstrual period 05/10/2019, SpO2 99 %, unknown if currently breastfeeding.  Physical Exam Vitals and nursing note reviewed.  Constitutional:  General: She is not in acute distress.    Appearance: She is well-developed.  HENT:     Head: Normocephalic.  Eyes:     Pupils: Pupils are equal, round, and reactive to light.  Cardiovascular:     Rate and Rhythm: Normal rate and regular rhythm.     Heart sounds: Normal heart sounds.  Pulmonary:     Effort: Pulmonary effort is normal. No respiratory distress.     Breath sounds: Normal breath sounds.  Abdominal:     General: Bowel sounds are normal. There is no distension.     Palpations: Abdomen is soft.     Tenderness: There is no abdominal tenderness.  Skin:    General: Skin is warm and dry.  Neurological:     Mental Status: She is alert and oriented to person, place, and time.  Psychiatric:        Behavior: Behavior normal.        Thought Content: Thought content normal.        Judgment: Judgment  normal.    Dilation: 1 Effacement (%): 50 Cervical Position: Middle Station: -2 Presentation: Vertex Exam by:: Marvel Plan RN   Fetal Tracing:  Baseline: 140 Variability: moderate Accels: 15x15 Decels: none  Toco: 4-10  MAU Course  Procedures  MDM NST [place] MFM BPP- 8/8 with normal AFI  Patient reports normal fetal movement since being in MAU  Cervix rechecked after 1 hour and unchanged. Patient breathing through some contractions but not all. Able to rest comfortably in the bed.    Report called to Dr. Connye Burkitt due to regular contractions and previous c/s- agreeable with plan discharge patient home with strict return precautions.   Assessment and Plan   1. False labor after 37 completed weeks of gestation   2. Decreased fetal movement   3. NST (non-stress test) reactive    -Discharge home in stable condition -Labor precautions discussed -Patient advised to follow-up with OB as scheduled on 11/16 for prenatal care -Patient may return to MAU as needed or if her condition were to change or worsen   Rolm Bookbinder CNM 02/01/2020, 10:23 AM

## 2020-02-01 NOTE — Discharge Instructions (Signed)
Braxton Hicks Contractions °Contractions of the uterus can occur throughout pregnancy, but they are not always a sign that you are in labor. You may have practice contractions called Braxton Hicks contractions. These false labor contractions are sometimes confused with true labor. °What are Braxton Hicks contractions? °Braxton Hicks contractions are tightening movements that occur in the muscles of the uterus before labor. Unlike true labor contractions, these contractions do not result in opening (dilation) and thinning of the cervix. Toward the end of pregnancy (32-34 weeks), Braxton Hicks contractions can happen more often and may become stronger. These contractions are sometimes difficult to tell apart from true labor because they can be very uncomfortable. You should not feel embarrassed if you go to the hospital with false labor. °Sometimes, the only way to tell if you are in true labor is for your health care provider to look for changes in the cervix. The health care provider will do a physical exam and may monitor your contractions. If you are not in true labor, the exam should show that your cervix is not dilating and your water has not broken. °If there are no other health problems associated with your pregnancy, it is completely safe for you to be sent home with false labor. You may continue to have Braxton Hicks contractions until you go into true labor. °How to tell the difference between true labor and false labor °True labor °· Contractions last 30-70 seconds. °· Contractions become very regular. °· Discomfort is usually felt in the top of the uterus, and it spreads to the lower abdomen and low back. °· Contractions do not go away with walking. °· Contractions usually become more intense and increase in frequency. °· The cervix dilates and gets thinner. °False labor °· Contractions are usually shorter and not as strong as true labor contractions. °· Contractions are usually irregular. °· Contractions  are often felt in the front of the lower abdomen and in the groin. °· Contractions may go away when you walk around or change positions while lying down. °· Contractions get weaker and are shorter-lasting as time goes on. °· The cervix usually does not dilate or become thin. °Follow these instructions at home: ° °· Take over-the-counter and prescription medicines only as told by your health care provider. °· Keep up with your usual exercises and follow other instructions from your health care provider. °· Eat and drink lightly if you think you are going into labor. °· If Braxton Hicks contractions are making you uncomfortable: °? Change your position from lying down or resting to walking, or change from walking to resting. °? Sit and rest in a tub of warm water. °? Drink enough fluid to keep your urine pale yellow. Dehydration may cause these contractions. °? Do slow and deep breathing several times an hour. °· Keep all follow-up prenatal visits as told by your health care provider. This is important. °Contact a health care provider if: °· You have a fever. °· You have continuous pain in your abdomen. °Get help right away if: °· Your contractions become stronger, more regular, and closer together. °· You have fluid leaking or gushing from your vagina. °· You pass blood-tinged mucus (bloody show). °· You have bleeding from your vagina. °· You have low back pain that you never had before. °· You feel your baby’s head pushing down and causing pelvic pressure. °· Your baby is not moving inside you as much as it used to. °Summary °· Contractions that occur before labor are   called Braxton Hicks contractions, false labor, or practice contractions. °· Braxton Hicks contractions are usually shorter, weaker, farther apart, and less regular than true labor contractions. True labor contractions usually become progressively stronger and regular, and they become more frequent. °· Manage discomfort from Braxton Hicks contractions  by changing position, resting in a warm bath, drinking plenty of water, or practicing deep breathing. °This information is not intended to replace advice given to you by your health care provider. Make sure you discuss any questions you have with your health care provider. °Document Revised: 02/17/2017 Document Reviewed: 07/21/2016 °Elsevier Patient Education © 2020 [employer]. ° °

## 2020-02-01 NOTE — MAU Note (Signed)
This rn rounding, pt reports she is feeling good fetal movement now.

## 2020-02-01 NOTE — MAU Note (Signed)
Suzanne Flores is a 24 y.o. at [redacted]w[redacted]d here in MAU reporting: abdominal pain since about 6 this AM. States pain is constant. Has started having a lot pressure. When she went to the bathroom saw some bloody discharge, states it was jelly like. DFM for the past couple hours. Unsure about LOF, states for the past couple hours she has been using the bathroom a lot and has had some leaking in between. States fluid is clear. Was 1 cm on Thursday.   Onset of complaint: today  Pain score: 9/10  Vitals:   02/01/20 0829  BP: 138/74  Pulse: 94  Resp: 16  Temp: 98.6 F (37 C)  SpO2: 99%     FHT: 136  Lab orders placed from triage: none

## 2020-02-02 ENCOUNTER — Inpatient Hospital Stay (HOSPITAL_COMMUNITY): Payer: Medicaid Other | Admitting: Certified Registered Nurse Anesthetist

## 2020-02-02 ENCOUNTER — Encounter (HOSPITAL_COMMUNITY)
Admission: AD | Disposition: A | Discharge status: Home / Self Care | Payer: Self-pay | Source: Home / Self Care | Attending: Obstetrics & Gynecology

## 2020-02-02 ENCOUNTER — Other Ambulatory Visit: Payer: Self-pay

## 2020-02-02 ENCOUNTER — Inpatient Hospital Stay (HOSPITAL_COMMUNITY)
Admission: AD | Admit: 2020-02-02 | Discharge: 2020-02-04 | DRG: 788 | Disposition: A | Discharge status: Home / Self Care | Payer: Medicaid Other | Attending: Obstetrics & Gynecology | Admitting: Obstetrics & Gynecology

## 2020-02-02 ENCOUNTER — Encounter (HOSPITAL_COMMUNITY): Payer: Self-pay | Admitting: Obstetrics and Gynecology

## 2020-02-02 DIAGNOSIS — Z3A38 38 weeks gestation of pregnancy: Secondary | ICD-10-CM | POA: Diagnosis not present

## 2020-02-02 DIAGNOSIS — O9902 Anemia complicating childbirth: Secondary | ICD-10-CM | POA: Diagnosis present

## 2020-02-02 DIAGNOSIS — L910 Hypertrophic scar: Secondary | ICD-10-CM | POA: Diagnosis present

## 2020-02-02 DIAGNOSIS — D649 Anemia, unspecified: Secondary | ICD-10-CM | POA: Diagnosis present

## 2020-02-02 DIAGNOSIS — Z98891 History of uterine scar from previous surgery: Secondary | ICD-10-CM

## 2020-02-02 DIAGNOSIS — O9972 Diseases of the skin and subcutaneous tissue complicating childbirth: Secondary | ICD-10-CM | POA: Diagnosis present

## 2020-02-02 DIAGNOSIS — O34211 Maternal care for low transverse scar from previous cesarean delivery: Principal | ICD-10-CM | POA: Diagnosis present

## 2020-02-02 DIAGNOSIS — Z8616 Personal history of COVID-19: Secondary | ICD-10-CM

## 2020-02-02 DIAGNOSIS — O99019 Anemia complicating pregnancy, unspecified trimester: Secondary | ICD-10-CM | POA: Diagnosis present

## 2020-02-02 HISTORY — DX: Other specified pregnancy related conditions, unspecified trimester: O26.899

## 2020-02-02 LAB — BASIC METABOLIC PANEL
Anion gap: 10 (ref 5–15)
BUN: 10 mg/dL (ref 6–20)
CO2: 20 mmol/L — ABNORMAL LOW (ref 22–32)
Calcium: 9.3 mg/dL (ref 8.9–10.3)
Chloride: 105 mmol/L (ref 98–111)
Creatinine, Ser: 0.55 mg/dL (ref 0.44–1.00)
GFR, Estimated: >60 mL/min (ref >60)
Glucose, Bld: 89 mg/dL (ref 70–99)
Potassium: 4.3 mmol/L (ref 3.5–5.1)
Sodium: 135 mmol/L (ref 135–145)

## 2020-02-02 LAB — TYPE AND SCREEN
ABO/RH(D): O POS
Antibody Screen: NEGATIVE

## 2020-02-02 LAB — CBC
HCT: 35.4 % — ABNORMAL LOW (ref 36.0–46.0)
Hemoglobin: 11.3 g/dL — ABNORMAL LOW (ref 12.0–15.0)
MCH: 25.7 pg — ABNORMAL LOW (ref 26.0–34.0)
MCHC: 31.9 g/dL (ref 30.0–36.0)
MCV: 80.6 fL (ref 80.0–100.0)
Platelets: 313 10*3/uL (ref 150–400)
RBC: 4.39 MIL/uL (ref 3.87–5.11)
RDW: 15.3 % (ref 11.5–15.5)
WBC: 10.4 10*3/uL (ref 4.0–10.5)
nRBC: 0 % (ref 0.0–0.2)

## 2020-02-02 LAB — RPR: RPR Ser Ql: NONREACTIVE

## 2020-02-02 SURGERY — (RADIOFREQUENCY) ABLATION
Anesthesia: Spinal | Laterality: Bilateral

## 2020-02-02 MED ORDER — MORPHINE SULFATE (PF) 0.5 MG/ML IJ SOLN
INTRAMUSCULAR | Status: AC
  Filled 2020-02-02: qty 10

## 2020-02-02 MED ORDER — SODIUM CHLORIDE 0.9% FLUSH: 3.0000 mL | INTRAVENOUS | Status: DC | PRN

## 2020-02-02 MED ORDER — SIMETHICONE 80 MG PO CHEW
80.0000 mg | CHEWABLE_TABLET | ORAL | Status: DC
  Administered 2020-02-03 (×2): 80 mg via ORAL
  Filled 2020-02-02 (×2): qty 1

## 2020-02-02 MED ORDER — CEFAZOLIN SODIUM-DEXTROSE 2-4 GM/100ML-% IV SOLN: 2.0000 g | INTRAVENOUS | Status: DC

## 2020-02-02 MED ORDER — POVIDONE-IODINE 10 % EX SWAB: 2.0000 "application " | Freq: Once | CUTANEOUS | Status: DC

## 2020-02-02 MED ORDER — SIMETHICONE 80 MG PO CHEW
80.0000 mg | CHEWABLE_TABLET | Freq: Three times a day (TID) | ORAL | Status: DC
  Administered 2020-02-02 – 2020-02-04 (×5): 80 mg via ORAL
  Filled 2020-02-02 (×6): qty 1

## 2020-02-02 MED ORDER — SOD CITRATE-CITRIC ACID 500-334 MG/5ML PO SOLN
30.0000 mL | ORAL | Status: AC
  Administered 2020-02-02: 30 mL via ORAL
  Filled 2020-02-02: qty 15

## 2020-02-02 MED ORDER — OXYCODONE HCL 5 MG PO TABS: 5.0000 mg | ORAL_TABLET | ORAL | Status: DC | PRN

## 2020-02-02 MED ORDER — KETOROLAC TROMETHAMINE 30 MG/ML IJ SOLN: 30.0000 mg | Freq: Once | INTRAMUSCULAR | Status: DC | PRN

## 2020-02-02 MED ORDER — SOD CITRATE-CITRIC ACID 500-334 MG/5ML PO SOLN: 30.0000 mL | Freq: Once | ORAL | Status: DC

## 2020-02-02 MED ORDER — ACETAMINOPHEN 500 MG PO TABS
1000.0000 mg | ORAL_TABLET | Freq: Four times a day (QID) | ORAL | Status: DC | PRN
  Administered 2020-02-02 – 2020-02-04 (×6): 1000 mg via ORAL
  Filled 2020-02-02 (×6): qty 2

## 2020-02-02 MED ORDER — SIMETHICONE 80 MG PO CHEW: 80.0000 mg | CHEWABLE_TABLET | ORAL | Status: DC | PRN

## 2020-02-02 MED ORDER — NALBUPHINE HCL 10 MG/ML IJ SOLN: 5.0000 mg | INTRAMUSCULAR | Status: DC | PRN

## 2020-02-02 MED ORDER — SENNOSIDES-DOCUSATE SODIUM 8.6-50 MG PO TABS
2.0000 | ORAL_TABLET | ORAL | Status: DC
  Administered 2020-02-03: 2 via ORAL
  Filled 2020-02-02 (×2): qty 2

## 2020-02-02 MED ORDER — KETOROLAC TROMETHAMINE 30 MG/ML IJ SOLN
INTRAMUSCULAR | Status: AC
  Filled 2020-02-02: qty 1

## 2020-02-02 MED ORDER — OXYTOCIN-SODIUM CHLORIDE 30-0.9 UT/500ML-% IV SOLN
INTRAVENOUS | Status: DC | PRN
  Administered 2020-02-02: 30 [IU] via INTRAVENOUS

## 2020-02-02 MED ORDER — MENTHOL 3 MG MT LOZG: 1.0000 | LOZENGE | OROMUCOSAL | Status: DC | PRN

## 2020-02-02 MED ORDER — NALBUPHINE HCL 10 MG/ML IJ SOLN: 5.0000 mg | Freq: Once | INTRAMUSCULAR | Status: DC | PRN

## 2020-02-02 MED ORDER — DIBUCAINE (PERIANAL) 1 % EX OINT: 1.0000 "application " | TOPICAL_OINTMENT | CUTANEOUS | Status: DC | PRN

## 2020-02-02 MED ORDER — FAMOTIDINE IN NACL 20-0.9 MG/50ML-% IV SOLN
20.0000 mg | Freq: Once | INTRAVENOUS | Status: AC
  Administered 2020-02-02: 20 mg via INTRAVENOUS
  Filled 2020-02-02: qty 50

## 2020-02-02 MED ORDER — HYDROMORPHONE HCL 1 MG/ML IJ SOLN: 0.2500 mg | INTRAMUSCULAR | Status: DC | PRN

## 2020-02-02 MED ORDER — FENTANYL CITRATE (PF) 100 MCG/2ML IJ SOLN
INTRAMUSCULAR | Status: DC | PRN
  Administered 2020-02-02: 15 ug via INTRAVENOUS

## 2020-02-02 MED ORDER — SCOPOLAMINE 1 MG/3DAYS TD PT72
1.0000 | MEDICATED_PATCH | Freq: Once | TRANSDERMAL | Status: DC
  Administered 2020-02-02: 1.5 mg via TRANSDERMAL

## 2020-02-02 MED ORDER — MEPERIDINE HCL 25 MG/ML IJ SOLN: 6.2500 mg | INTRAMUSCULAR | Status: DC | PRN

## 2020-02-02 MED ORDER — SODIUM CHLORIDE 0.9 % IR SOLN
Status: DC | PRN
  Administered 2020-02-02: 1

## 2020-02-02 MED ORDER — LACTATED RINGERS IV SOLN: INTRAVENOUS | Status: DC | PRN

## 2020-02-02 MED ORDER — DEXAMETHASONE SODIUM PHOSPHATE 4 MG/ML IJ SOLN
INTRAMUSCULAR | Status: AC
  Filled 2020-02-02: qty 1

## 2020-02-02 MED ORDER — NALOXONE HCL 4 MG/10ML IJ SOLN
1.0000 ug/kg/h | INTRAVENOUS | Status: DC | PRN
  Filled 2020-02-02: qty 5

## 2020-02-02 MED ORDER — PHENYLEPHRINE HCL-NACL 20-0.9 MG/250ML-% IV SOLN
INTRAVENOUS | Status: DC | PRN
  Administered 2020-02-02: 60 ug/min via INTRAVENOUS

## 2020-02-02 MED ORDER — ONDANSETRON HCL 4 MG/2ML IJ SOLN
INTRAMUSCULAR | Status: DC | PRN
  Administered 2020-02-02: 4 mg via INTRAVENOUS

## 2020-02-02 MED ORDER — DIPHENHYDRAMINE HCL 50 MG/ML IJ SOLN: 12.5000 mg | INTRAMUSCULAR | Status: DC | PRN

## 2020-02-02 MED ORDER — PHENYLEPHRINE HCL-NACL 20-0.9 MG/250ML-% IV SOLN
INTRAVENOUS | Status: AC
  Filled 2020-02-02: qty 250

## 2020-02-02 MED ORDER — NALOXONE HCL 0.4 MG/ML IJ SOLN: 0.4000 mg | INTRAMUSCULAR | Status: DC | PRN

## 2020-02-02 MED ORDER — DEXAMETHASONE SODIUM PHOSPHATE 4 MG/ML IJ SOLN
INTRAMUSCULAR | Status: DC | PRN
  Administered 2020-02-02: 4 mg via INTRAVENOUS

## 2020-02-02 MED ORDER — IBUPROFEN 600 MG PO TABS
600.0000 mg | ORAL_TABLET | Freq: Four times a day (QID) | ORAL | Status: DC
  Administered 2020-02-02 – 2020-02-04 (×8): 600 mg via ORAL
  Filled 2020-02-02 (×8): qty 1

## 2020-02-02 MED ORDER — HYDROMORPHONE HCL 1 MG/ML IJ SOLN
0.2000 mg | INTRAMUSCULAR | Status: DC | PRN
  Administered 2020-02-02: 0.4 mg via INTRAVENOUS
  Filled 2020-02-02: qty 1

## 2020-02-02 MED ORDER — ZOLPIDEM TARTRATE 5 MG PO TABS: 5.0000 mg | ORAL_TABLET | Freq: Every evening | ORAL | Status: DC | PRN

## 2020-02-02 MED ORDER — FENTANYL CITRATE (PF) 100 MCG/2ML IJ SOLN: 50.0000 ug | INTRAMUSCULAR | Status: DC | PRN

## 2020-02-02 MED ORDER — COCONUT OIL OIL: 1.0000 "application " | TOPICAL_OIL | Status: DC | PRN

## 2020-02-02 MED ORDER — ONDANSETRON HCL 4 MG/2ML IJ SOLN: 4.0000 mg | Freq: Once | INTRAMUSCULAR | Status: DC | PRN

## 2020-02-02 MED ORDER — OXYTOCIN-SODIUM CHLORIDE 30-0.9 UT/500ML-% IV SOLN: 2.5000 [IU]/h | INTRAVENOUS | Status: AC

## 2020-02-02 MED ORDER — ONDANSETRON HCL 4 MG/2ML IJ SOLN
INTRAMUSCULAR | Status: AC
  Filled 2020-02-02: qty 2

## 2020-02-02 MED ORDER — FENTANYL CITRATE (PF) 100 MCG/2ML IJ SOLN
INTRAMUSCULAR | Status: AC
  Filled 2020-02-02: qty 2

## 2020-02-02 MED ORDER — LACTATED RINGERS IV SOLN: INTRAVENOUS | Status: DC

## 2020-02-02 MED ORDER — WITCH HAZEL-GLYCERIN EX PADS: 1.0000 "application " | MEDICATED_PAD | CUTANEOUS | Status: DC | PRN

## 2020-02-02 MED ORDER — CEFAZOLIN SODIUM-DEXTROSE 2-4 GM/100ML-% IV SOLN
2.0000 g | INTRAVENOUS | Status: AC
  Administered 2020-02-02: 2 g via INTRAVENOUS
  Filled 2020-02-02: qty 100

## 2020-02-02 MED ORDER — DIPHENHYDRAMINE HCL 25 MG PO CAPS: 25.0000 mg | ORAL_CAPSULE | ORAL | Status: DC | PRN

## 2020-02-02 MED ORDER — ONDANSETRON HCL 4 MG/2ML IJ SOLN: 4.0000 mg | Freq: Three times a day (TID) | INTRAMUSCULAR | Status: DC | PRN

## 2020-02-02 MED ORDER — MORPHINE SULFATE (PF) 0.5 MG/ML IJ SOLN
INTRAMUSCULAR | Status: DC | PRN
  Administered 2020-02-02: 150 ug via INTRATHECAL

## 2020-02-02 MED ORDER — PRENATAL MULTIVITAMIN CH
1.0000 | ORAL_TABLET | Freq: Every day | ORAL | Status: DC
  Administered 2020-02-03 – 2020-02-04 (×2): 1 via ORAL
  Filled 2020-02-02 (×2): qty 1

## 2020-02-02 MED ORDER — OXYTOCIN-SODIUM CHLORIDE 30-0.9 UT/500ML-% IV SOLN
INTRAVENOUS | Status: AC
  Filled 2020-02-02: qty 500

## 2020-02-02 MED ORDER — DIPHENHYDRAMINE HCL 25 MG PO CAPS: 25.0000 mg | ORAL_CAPSULE | Freq: Four times a day (QID) | ORAL | Status: DC | PRN

## 2020-02-02 MED ORDER — STERILE WATER FOR IRRIGATION IR SOLN
Status: DC | PRN
  Administered 2020-02-02: 1

## 2020-02-02 MED ORDER — KETOROLAC TROMETHAMINE 30 MG/ML IJ SOLN
30.0000 mg | Freq: Once | INTRAMUSCULAR | Status: AC
  Administered 2020-02-02: 30 mg via INTRAVENOUS

## 2020-02-02 MED ORDER — BUPIVACAINE IN DEXTROSE 0.75-8.25 % IT SOLN
INTRATHECAL | Status: DC | PRN
  Administered 2020-02-02: 1.4 mL via INTRATHECAL

## 2020-02-02 MED ORDER — TETANUS-DIPHTH-ACELL PERTUSSIS 5-2.5-18.5 LF-MCG/0.5 IM SUSY: 0.5000 mL | PREFILLED_SYRINGE | Freq: Once | INTRAMUSCULAR | Status: DC

## 2020-02-02 MED ORDER — SCOPOLAMINE 1 MG/3DAYS TD PT72
MEDICATED_PATCH | TRANSDERMAL | Status: AC
  Filled 2020-02-02: qty 1

## 2020-02-02 SURGICAL SUPPLY — 40 items
BENZOIN TINCTURE PRP APPL 2/3 (GAUZE/BANDAGES/DRESSINGS) ×3 IMPLANT
CHLORAPREP W/TINT 26ML (MISCELLANEOUS) ×3 IMPLANT
CLAMP CORD UMBIL (MISCELLANEOUS) IMPLANT
CLOSURE STERI STRIP 1/2 X4 (GAUZE/BANDAGES/DRESSINGS) ×2 IMPLANT
CLOSURE WOUND 1/2 X4 (GAUZE/BANDAGES/DRESSINGS) ×1
CLOTH BEACON ORANGE TIMEOUT ST (SAFETY) ×3 IMPLANT
DRSG OPSITE POSTOP 4X10 (GAUZE/BANDAGES/DRESSINGS) ×3 IMPLANT
ELECT REM PT RETURN 9FT ADLT (ELECTROSURGICAL) ×3
ELECTRODE REM PT RTRN 9FT ADLT (ELECTROSURGICAL) ×1 IMPLANT
EXTRACTOR VACUUM M CUP 4 TUBE (SUCTIONS) IMPLANT
EXTRACTOR VACUUM M CUP 4' TUBE (SUCTIONS)
GLOVE BIO SURGEON STRL SZ7.5 (GLOVE) ×3 IMPLANT
GLOVE BIOGEL PI IND STRL 7.0 (GLOVE) ×2 IMPLANT
GLOVE BIOGEL PI IND STRL 7.5 (GLOVE) ×1 IMPLANT
GLOVE BIOGEL PI INDICATOR 7.0 (GLOVE) ×4
GLOVE BIOGEL PI INDICATOR 7.5 (GLOVE) ×2
GOWN STRL REUS W/TWL LRG LVL3 (GOWN DISPOSABLE) ×9 IMPLANT
HEMOSTAT ARISTA ABSORB 3G PWDR (HEMOSTASIS) ×3 IMPLANT
HOVERMATT SINGLE USE (MISCELLANEOUS) ×3 IMPLANT
KIT ABG SYR 3ML LUER SLIP (SYRINGE) IMPLANT
NEEDLE HYPO 22GX1.5 SAFETY (NEEDLE) IMPLANT
NEEDLE HYPO 25X5/8 SAFETYGLIDE (NEEDLE) IMPLANT
NS IRRIG 1000ML POUR BTL (IV SOLUTION) ×3 IMPLANT
PACK C SECTION WH (CUSTOM PROCEDURE TRAY) ×3 IMPLANT
PAD OB MATERNITY 4.3X12.25 (PERSONAL CARE ITEMS) ×3 IMPLANT
PENCIL SMOKE EVAC W/HOLSTER (ELECTROSURGICAL) ×3 IMPLANT
RTRCTR C-SECT PINK 25CM LRG (MISCELLANEOUS) ×3 IMPLANT
STRIP CLOSURE SKIN 1/2X4 (GAUZE/BANDAGES/DRESSINGS) ×2 IMPLANT
SUT CHROMIC 2 0 CT 1 (SUTURE) ×3 IMPLANT
SUT MNCRL AB 3-0 PS2 27 (SUTURE) ×3 IMPLANT
SUT PLAIN 2 0 XLH (SUTURE) ×3 IMPLANT
SUT VIC AB 0 CT1 36 (SUTURE) ×3 IMPLANT
SUT VIC AB 0 CTX 36 (SUTURE) ×6
SUT VIC AB 0 CTX36XBRD ANBCTRL (SUTURE) ×3 IMPLANT
SUT VIC AB 2-0 SH 27 (SUTURE) ×4
SUT VIC AB 2-0 SH 27XBRD (SUTURE) ×2 IMPLANT
SYR CONTROL 10ML LL (SYRINGE) IMPLANT
TOWEL OR 17X24 6PK STRL BLUE (TOWEL DISPOSABLE) ×3 IMPLANT
TRAY FOLEY W/BAG SLVR 14FR LF (SET/KITS/TRAYS/PACK) ×3 IMPLANT
WATER STERILE IRR 1000ML POUR (IV SOLUTION) ×3 IMPLANT

## 2020-02-02 NOTE — Op Note (Signed)
Pre Op Dx:  Labor, SROM, History of cesarean desires repeat Post Op Dx: Same Procedure:  Low Transverse Cesarean Section, Scar revision  Surgeon:  Dr. Steva Ready Assistants:  Bernerd Pho, CNM Anesthesia:  Spinal  QBL:  368cc  IVF:  2000cc UOP:  400cc  Drains:  Foley catheter Specimen removed:  Placenta Device(s) implanted:  None Case Type:  Clean-contaminated Findings: Normal-appearing uterus, bilateral fallopian tubes, and ovaries.  Thin lower uterine segment.  Bladder scarred to lower uterine segment.  Mild fascial adhesive disease.  Infant in cephalic position, amniotic fluid clear.  Complications: None Indications:  24 y.o. [id number] at [redacted]w[redacted]d presented in labor and while in MAU subsequently ruptured her membranes. She has a history of cesarean section and desired a repeat. Procedure:  After informed consent was obtained, the patient was brought to the operating room.  Following administration of spinal anesthesia, the patient was positioned in dorsal supine position with a leftward tilt and was prepped and draped in sterile fashion.  A preoperative time-out was performed.  The abdomen was entered in layers through a pfannenstiel incision and an Teacher, early years/pre was placed.  A bladder flap was created in standard fashion sharply. A low transverse hysterotomy was created sharply to the level of the membranes, then extended bluntly.  The fetus was delivered from cephalic presentation onto the field.  Bulb suctioning was performed.  The cord was doubly clamped and cut after a 60 second pause.  The newborn was passed to the awaiting NICU team.  The placenta was delivered.  The uterus was swept free of clots and debris and closed in a running locked fashion with 0-Monocryl. Three additional figure-of-eight sutures were placed along the hysterotomy for additional hemostasis.  Hemostasis was verified.  The abdomen was irrigated with warmed saline and cleared of clots.  Arista powder was placed  along the hysterotomy. Subfascial spaces were inspected and hemostasis assured.  The fascia was closed in a running fashion with 0-PDS.  The subcutaneous tissues were irrigated and hemostasis assured.  The keloid scar was excised sharply.  The subcutaneous tissues were closed with 3-0 Monocryl in one running layer and then an additional interrupted layer.  The skin was closed with 4-0 Vicryl.  A sterile bandage was applied.  The patient was transferred to PACU.  All needle, sponge, and instrument counts were correct at the end of the case.   Disposition:  PACU  Steva Ready, DO

## 2020-02-02 NOTE — Transfer of Care (Signed)
Immediate Anesthesia Transfer of Care Note  Patient: Suzanne Flores  Procedure(s) Performed: REPEAT CESAREAN SECTION  SCAR REVISION (Bilateral )  Patient Location: PACU  Anesthesia Type:Spinal  Level of Consciousness: awake  Airway & Oxygen Therapy: Patient Spontanous Breathing  Post-op Assessment: Report given to RN and Post -op Vital signs reviewed and stable  Post vital signs: Reviewed and stable  Last Vitals:  Vitals Value Taken Time  BP 113/90 02/02/20 0819  Temp    Pulse 95 02/02/20 0820  Resp 20 02/02/20 0820  SpO2 100 % 02/02/20 0820  Vitals shown include unvalidated device data.  Last Pain:  Vitals:   02/02/20 0347  TempSrc:   PainSc: 10-Worst pain ever      Patients Stated Pain Goal: 0 (02/02/20 0347)  Complications: No complications documented.

## 2020-02-02 NOTE — Addendum Note (Signed)
Addendum  created 02/02/20 1450 by Renford Dills, CRNA   Clinical Note Signed

## 2020-02-02 NOTE — Anesthesia Preprocedure Evaluation (Signed)
Anesthesia Evaluation  Patient identified by MRN, date of birth, ID band Patient awake    Reviewed: Patient's Chart, lab work & pertinent test results  Airway Mallampati: II  TM Distance: >3 FB Neck ROM: Full    Dental  (+) Teeth Intact   Pulmonary neg pulmonary ROS,    Pulmonary exam normal        Cardiovascular hypertension, Pt. on medications  Rhythm:Regular Rate:Normal     Neuro/Psych negative neurological ROS     GI/Hepatic Neg liver ROS, GERD  Controlled,  Endo/Other  negative endocrine ROS  Renal/GU negative Renal ROS  negative genitourinary   Musculoskeletal negative musculoskeletal ROS (+)   Abdominal (+)  Abdomen: soft. Bowel sounds: normal.  Peds  Hematology   Anesthesia Other Findings   Reproductive/Obstetrics (+) Pregnancy                             Anesthesia Physical Anesthesia Plan  ASA: II  Anesthesia Plan: Spinal   Post-op Pain Management:    Induction:   PONV Risk Score and Plan: 2 and Ondansetron, Dexamethasone and Treatment may vary due to age or medical condition  Airway Management Planned: Simple Face Mask, Natural Airway and Nasal Cannula  Additional Equipment: None  Intra-op Plan:   Post-operative Plan:   Informed Consent: I have reviewed the patients History and Physical, chart, labs and discussed the procedure including the risks, benefits and alternatives for the proposed anesthesia with the patient or authorized representative who has indicated his/her understanding and acceptance.     Dental advisory given  Plan Discussed with: CRNA  Anesthesia Plan Comments: (Lab Results      Component                Value               Date                      WBC                      10.4                02/02/2020                HGB                      11.3 (L)            02/02/2020                HCT                      35.4 (L)             02/02/2020                MCV                      80.6                02/02/2020                PLT                      313                 02/02/2020          )  Anesthesia Quick Evaluation  

## 2020-02-02 NOTE — Lactation Note (Addendum)
This note was copied from a baby's chart. Lactation Consultation Note  Patient Name: Suzanne Flores [id number] Date: 02/02/2020 Reason for consult: Follow-up assessment;Early term 64-38.6wks  Visited with mom of a 6 hours old ETI female born through C/S, she's a P2 and reported she BF her second child for 3 months but the last 3 were exclusively pumping and bottle feeding due to a difficult latch. Mom has flat nipples and her areolar tissue is not very compressible, especially the right side. However, when [facility] assisted with hand expression, she was able to get small droplets of colostrum, LC rubbed them in baby's mouth, he was doing STS with mom when entering the room, praised her for her efforts.  Offered assistance with latch and mom agreed to wake baby up to feed. Mom took baby STS to her right breast in cross cradle position but he wouldn't latch on, he kept slipping of the breast, baby was very spitty. Then, LC got a NS # 24 per mom's request, she said she had to use one with her first baby. Tried latching baby again, but he wouldn't latch, he started crying, mom stopped this attempt and LC encouraged her to keep baby STS and to call for latch assistance when needed.  She participated in the [facility] program at [facility] and she already has a Medela DEBP at home that she brought to the hospital. When [facility] was revising feeding plan with mom and offered to set her up with a DEBP since she'll be using the NS, mom declined, she said she brought her pump from home and that's the one she'll be using at the hospital. Mom understands her areolar tissue needs prep for using a NS, tissue is not very compressible at this point.  Mom understands lactation is unable to set up personal pumps at the hospital. [facility] brought cleaning supplies for pump and breast shells, instructions, cleaning and storage were reviewed as well as milk storage guidelines. Reviewed normal newborn behavior, feeding cues, size of baby's stomach and  pumping schedule.  Feeding plan:  1. Encouraged mom to put baby to breast STS 8-12 times/24 hours or sooner if feeding cues are present; using NS # 24 PRN  2. Hand expression and finger/spoon feeding were also encouraged 3. Mom will start pumping today, ideally every 3 hours 4. She'll start wearing her breast shells tomorrow, daytime only   BF brochure, BF resources and feeding diary were reviewed. FOB present in the room at the time of [facility] consultation but he was asleep. Mom reported all questions and concerns were answered, she's aware of LC OP services and will call PRN.   Maternal Data Formula Feeding for Exclusion: Yes Reason for exclusion: Mother's choice to formula and breast feed on admission Has patient been taught Hand Expression?: Yes Does the patient have breastfeeding experience prior to this delivery?: Yes  Feeding Feeding Type: Breast Fed  LATCH Score                   Interventions Interventions: Breast feeding basics reviewed;Assisted with latch;Skin to skin;Breast massage;Hand express;Breast compression;Reverse pressure;Shells;DEBP;Position options;Support pillows;Adjust position  Lactation Tools Discussed/Used Tools: Pump;Nipple Dorris Carnes;Shells Nipple shield size: 24 Shell Type: Inverted Breast pump type: Double-Electric Breast Pump (it's her own from home) [facility] Program: Yes Pump Review: Milk Storage;Setup, frequency, and cleaning Initiated by:: Self; and MPeck did breastmilk storage guidelines and provided cleaning supplies for DEBP Date initiated:: 02/02/20   Consult Status Consult Status: Follow-up Date: 02/03/20 Follow-up type:  In-patient    Nykole Matos Venetia Constable 02/02/2020, 2:10 PM

## 2020-02-02 NOTE — H&P (Signed)
OB ADMISSION/ HISTORY & PHYSICAL:  Admission Date: 02/02/2020  3:35 AM  Admit Diagnosis: Previous cesarean section desires elective repeat  Suzanne Flores is a 24 y.o. female G2P1001 [redacted]w[redacted]d presenting for contractions since 0200. Endorses active FM, denies LOF and vaginal bleeding. Primary C/S for pre-eclampsia and chorio remote from delivery. Desires repeat, PIH labs normal, baby ASA since 2nd trimester. Requested a BTL intrapartum and has decided that she she no longer desires permanent sterilization. Covid pos on 12/20/19, neg test on 12/28/19, asymptomatic today, has completed Covid vaccine.   History of current pregnancy: G2P1001  Patient entered care with [facility] at 7 wks.   EDC 02/14/20 by LMP Significant prenatal events:  Patient Active Problem List   Diagnosis Date Noted   Previous cesarean section 02/02/2020   Breast mass, right 06/27/2019   Referred ear pain, right 06/03/2019   Anemia of pregnancy 11/23/2016    Prenatal Labs: ABO, Rh: --/--/PENDING (11/14 0502) (O per office records) Antibody: PENDING (11/14 0502) (positive per office records) Rubella:   immune RPR:   NR HBsAg:   NR HIV:   NR GTT: passed 1 hr GBS: Negative/-- (10/25 0000)  GC/CHL: neg/neg Genetics: low-risk female Tdap/influenza vaccines: both UTD   OB History  Gravida Para Term Preterm AB Living  2 1 1     1   SAB TAB Ectopic Multiple Live Births        0 1    # Outcome Date GA Lbr Len/2nd Weight Sex Delivery Anes PTL Lv  2 Current           1 Term 02/17/17 [redacted]w[redacted]d  3160 g F CS-LTranv EPI  LIV     Birth Comments: pre eclampsia    Medical / Surgical History: Past medical history:  Past Medical History:  Diagnosis Date   Anxiety    Heartburn during pregnancy    History of pre-eclampsia in prior pregnancy, currently pregnant    Pregnancy induced hypertension     Past surgical history:  Past Surgical History:  Procedure Laterality Date   CESAREAN SECTION N/A 02/17/2017   Procedure:  CESAREAN SECTION;  Surgeon: 02/19/2017, DO;  Location: [facility];  Service: Obstetrics;  Laterality: N/A;   WISDOM TOOTH EXTRACTION     Family History:  Family History  Problem Relation Age of Onset   Thyroid disease Mother     Social History:  reports that she has never smoked. She has never used smokeless tobacco. She reports that she does not drink alcohol and does not use drugs.  Allergies: Tape, Avocado, and Cinnamon   Current Medications at time of admission:  Prior to Admission medications   Medication Sig Start Date End Date Taking? Authorizing Provider  acetaminophen (TYLENOL) 500 MG tablet Take 500 mg by mouth every 6 (six) hours as needed for mild pain, moderate pain or headache.   Yes [provider]  aspirin 81 MG chewable tablet Chew by mouth daily.   Yes [provider]  Prenatal Vit-Fe Fumarate-FA (PRENATAL COMPLETE) 14-0.4 MG TABS Take 1 tablet daily 06/07/19  Yes Henderly, Britni A, PA-C  polyethylene glycol powder (GLYCOLAX/MIRALAX) powder Take 17 g by mouth daily as needed. Patient not taking: Reported on 06/14/2016 04/05/16   04/07/16, PA-C    Review of Systems: Constitutional: Negative   HENT: Negative   Eyes: Negative   Respiratory: Negative   Cardiovascular: Negative   Gastrointestinal: Negative  Genitourinary: neg for bloody show, neg for LOF   Musculoskeletal: Negative  Skin: Negative   Neurological: Negative   Endo/Heme/Allergies: Negative   Psychiatric/Behavioral: Negative    Physical Exam: VS: Blood pressure 124/75, pulse (!) 114, temperature 97.9 F (36.6 C), temperature source Oral, resp. rate 18, last menstrual period 05/10/2019, SpO2 100 %, unknown if currently breastfeeding. AAO x3, no signs of distress Cardiovascular: RRR Respiratory: Lung fields clear to ausculation GU/GI: Abdomen gravid, non-tender, non-distended, active FM, vertex, EFW 7# per Leopold's Extremities: no edema, negative for pain,  tenderness, and cords  Cervical exam:Dilation: 3.5 Effacement (%): 80 Station: -1 Exam by:: Ginnie Smart RN FHR: baseline rate 135 / variability moderate / accelerations present / absent decelerations TOCO: 5-6 min   Prenatal Transfer Tool  Maternal Diabetes: No Genetic Screening: Normal Maternal Ultrasounds/Referrals: Normal Fetal Ultrasounds or other Referrals:  None Maternal Substance Abuse:  No Significant Maternal Medications:  None Significant Maternal Lab Results: Group B Strep negative    Assessment: 24 y.o. G2P1001 [redacted]w[redacted]d Prev C/S for pre-eclampsia and chorio remote from delivery   Latent stage of labor FHR category 1 GBS neg Pain management plan: per anesthesia   Plan:  Admit to L&D OR Routine pre-op admission orders Dr Connye Burkitt notified of admission and plan of care  Roma Schanz MSN, CNM 02/02/2020 5:21 AM

## 2020-02-02 NOTE — Anesthesia Procedure Notes (Signed)
Spinal  Patient location during procedure: OR Start time: 02/02/2020 6:53 AM End time: 02/02/2020 6:54 AM Reason for block: at surgeon's request Staffing Performed: anesthesiologist  Anesthesiologist: Atilano Median, DO Preanesthetic Checklist Completed: patient identified, IV checked, site marked, risks and benefits discussed, surgical consent, monitors and equipment checked, pre-op evaluation and timeout performed Spinal Block Patient position: sitting Prep: DuraPrep Patient monitoring: heart rate, cardiac monitor, continuous pulse ox and blood pressure Approach: midline Location: L3-4 Injection technique: single-shot Needle Needle type: Pencan  Needle gauge: 24 G Needle length: 10 cm Additional Notes Patient identified. Risks/Benefits/Options discussed with patient including but not limited to bleeding, infection, nerve damage, paralysis, failed block, incomplete pain control, headache, blood pressure changes, nausea, vomiting, reactions to medications, itching and postpartum back pain. Confirmed with bedside nurse the patient's most recent platelet count. Confirmed with patient that they are not currently taking any anticoagulation, have any bleeding history or any family history of bleeding disorders. Patient expressed understanding and wished to proceed. All questions were answered. Sterile technique was used throughout the entire procedure. Please see nursing notes for vital signs. Warning signs of high block given to the patient including shortness of breath, tingling/numbness in hands, complete motor block, or any concerning symptoms with instructions to call for help. Patient was given instructions on fall risk and not to get out of bed. All questions and concerns addressed with instructions to call with any issues or inadequate analgesia.

## 2020-02-02 NOTE — Anesthesia Postprocedure Evaluation (Signed)
Anesthesia Post Note  Patient: Suzanne Flores  Procedure(s) Performed: REPEAT CESAREAN SECTION  SCAR REVISION (Bilateral )     Patient location during evaluation: Mother Baby Anesthesia Type: Spinal Level of consciousness: oriented and awake and alert Pain management: pain level controlled Vital Signs Assessment: post-procedure vital signs reviewed and stable Respiratory status: spontaneous breathing and respiratory function stable Cardiovascular status: blood pressure returned to baseline and stable Postop Assessment: no headache, no backache, no apparent nausea or vomiting and able to ambulate Anesthetic complications: no   No complications documented.  Last Vitals:  Vitals:   02/02/20 1330 02/02/20 1440  BP: 129/74 120/69  Pulse: 98 75  Resp: 18 18  Temp: 36.7 C (!) 36.4 C  SpO2: 99% 100%    Last Pain:  Vitals:   02/02/20 1440  TempSrc: Oral  PainSc:    Pain Goal: Patients Stated Pain Goal: 0 (02/02/20 0347)                 Fanny Dance

## 2020-02-02 NOTE — MAU Note (Signed)
Patient reports to MAU complaining of ctx every 2-7 minutes. Reports vaginal bleeding, patient describes it as period-like "but not thick." Present when she wipes and some blood in the toilet when she urinated this morning.  Patient denies leaking of fluid. +FM.

## 2020-02-02 NOTE — Anesthesia Postprocedure Evaluation (Signed)
Anesthesia Post Note  Patient: Suzanne Flores  Procedure(s) Performed: REPEAT CESAREAN SECTION  SCAR REVISION (Bilateral )     Patient location during evaluation: PACU Anesthesia Type: Spinal Level of consciousness: awake Pain management: pain level controlled Vital Signs Assessment: post-procedure vital signs reviewed and stable Respiratory status: spontaneous breathing Cardiovascular status: stable Postop Assessment: no apparent nausea or vomiting, no headache, no backache, spinal receding and patient able to bend at knees Anesthetic complications: no   No complications documented.  Last Vitals:  Vitals:   02/02/20 0915 02/02/20 0930  BP: 111/74 123/86  Pulse: 95 78  Resp: (!) 26 (!) 23  Temp:    SpO2: 99% 98%    Last Pain:  Vitals:   02/02/20 0930  TempSrc:   PainSc: 0-No pain   Pain Goal: Patients Stated Pain Goal: 0 (02/02/20 0347)  LLE Motor Response: Purposeful movement (02/02/20 0930) LLE Sensation: Full sensation (02/02/20 0930) RLE Motor Response: Purposeful movement (02/02/20 0930) RLE Sensation: Full sensation (02/02/20 0930)     Epidural/Spinal Function Cutaneous sensation: Normal sensation (02/02/20 0930), Patient able to flex knees: Yes (02/02/20 0930), Patient able to lift hips off bed: No (02/02/20 0930), Back pain beyond tenderness at insertion site: No (02/02/20 0930), Progressively worsening motor and/or sensory loss: No (02/02/20 0930), Bowel and/or bladder incontinence post epidural: No (02/02/20 0930)  Caren Macadam

## 2020-02-03 LAB — CBC
HCT: 28.2 % — ABNORMAL LOW (ref 36.0–46.0)
Hemoglobin: 9.1 g/dL — ABNORMAL LOW (ref 12.0–15.0)
MCH: 25.9 pg — ABNORMAL LOW (ref 26.0–34.0)
MCHC: 32.3 g/dL (ref 30.0–36.0)
MCV: 80.3 fL (ref 80.0–100.0)
Platelets: 243 10*3/uL (ref 150–400)
RBC: 3.51 MIL/uL — ABNORMAL LOW (ref 3.87–5.11)
RDW: 15.5 % (ref 11.5–15.5)
WBC: 12 10*3/uL — ABNORMAL HIGH (ref 4.0–10.5)
nRBC: 0 % (ref 0.0–0.2)

## 2020-02-03 MED ORDER — POLYSACCHARIDE IRON COMPLEX 150 MG PO CAPS
150.0000 mg | ORAL_CAPSULE | Freq: Every day | ORAL | Status: DC
  Administered 2020-02-03 – 2020-02-04 (×2): 150 mg via ORAL
  Filled 2020-02-03 (×2): qty 1

## 2020-02-03 NOTE — Progress Notes (Signed)
Suzanne Flores [id number] Postpartum Postoperative Day # 1  Suzanne Flores, [id number], [redacted]w[redacted]d, S/P Repeat LT Cesarean Section due to elective repeat C/S.   Subjective: Patient up ad lib, denies syncope or dizziness. Reports consuming regular diet without issues and denies N/V. Patient reports negative bowel movement, + passing flatus.  Denies issues with urination and reports bleeding is "light."  Patient is breast and bottle feeding and reports going well.  Pain is being appropriately managed with use of ibuprofen and tylenol.    Objective: Patient Vitals for the past 24 hrs:  BP Temp Temp src Pulse Resp SpO2  02/03/20 0531 121/66 98 F (36.7 C) Oral 87 16 --  02/03/20 0033 (!) 124/57 -- -- 73 16 --  02/02/20 2013 136/70 98 F (36.7 C) Oral 81 18 99 %  02/02/20 1751 -- 98 F (36.7 C) Oral -- -- 99 %  02/02/20 1611 119/69 98 F (36.7 C) Oral 83 18 99 %  02/02/20 1440 120/69 (!) 97.5 F (36.4 C) Oral 75 18 100 %  02/02/20 1330 129/74 98.1 F (36.7 C) Oral 98 18 99 %     Physical Exam:  General: alert, cooperative and no distress Mood/Affect: happy with birth of baby. Lungs: clear to auscultation, no wheezes, rales or rhonchi, symmetric air entry.  Heart: normal rate, regular rhythm, normal S1, S2, no murmurs, rubs, clicks or gallops. Breast: breasts appear normal, no suspicious masses, no skin or nipple changes or axillary nodes. Abdomen:  + bowel sounds, soft, non-tender Incision: healing well, Honeycomb dressing  Uterine Fundus: firm Lochia: appropriate Skin: Warm, Dry. DVT Evaluation: No evidence of DVT seen on physical exam.   Labs: Recent Labs    02/02/20 0505 02/03/20 0513  HGB 11.3* 9.1*  HCT 35.4* 28.2*  WBC 10.4 12.0*    CBG (last 3)  No results for input(s): GLUCAP in the last 72 hours.   I/O: I/O last 3 completed shifts: In: 3613.9 [P.O.:916; I.V.:2697.9] Out: 4488 [Urine:4100; Blood:388]   Assessment Postpartum Postoperative Day # 1. Suzanne Flores, [id number],  [redacted]w[redacted]d, S/P Repeat LT Cesarean Section due to elective repeat c/s.  Pt stable. Involution. Breast and bottle Feeding. Hemodynamically Stable.  Plan: Continue other mgmt as ordered VTE Prophylactics: Ambulation Pain control: Motrin/Tylenol/Narcotics PRN Education given regarding options for ambulation, pain management.Plan for discharge tomorrow  Dr. Su Hilt updated on patient status  Suzanne Leitz, MSN, CNM 02/03/2020, 1:08 PM

## 2020-02-03 NOTE — Lactation Note (Signed)
This note was copied from a baby's chart. Lactation Consultation Note  Patient Name: Boy Zarra Geffert OBSJG'G Date: 02/03/2020 Reason for consult: Follow-up assessment  Follow-up visit to 38 hours infant with 4.64% weight loss.  Father is holding infant upon arrival. Infant is using a pacifier. Mother states it is time to feed infant. Offered to assist with latch. Mother explains infant seems to latch well using a 20 mm nipple shield to left breast in football position, but has difficulty latching to right breast when in football position.  Undressed infant and set up support pillows for football position to left breast. Hand expressed some colostrum for latch. Mother verbalized discomfort. Colostrum is easily expressed. Latched infant after a few attempts. Noted suckling and swallowing. Demonstrated alignment.  Mother reports biting/clenching. Showed how to do a chin tug. Mother reports improvement.   Educated mom about deep latch for an optimal breastfeeding session.  After 12 minutes, mother burped infant. Assisted with latch to right breast in modified cradle. Infant latch with ease. Unlatched after a few seconds. Instilled some formula inside nipple shield. Infant able to resume feeding with with good tissue movement, suckling and swallowing.   Encouraged using supplementation at breast instead of curved tip syringe with finger-feeding. Mother verbalizes in agreement.  Plan:   1-Breastfeeding on demand, ensuring a deep, comfortable latch.  2-Undressing infant and place skin to skin when ready to breastfeed 3-Keep infant awake during breastfeeding session: massaging breast, infant's hand/shoulder/feet 4-Use formula inside nipple shield to supplement at breast and facilitate feeding 5-Monitor voids and stools as signs good intake.  6-Contact LC as needed for feeds/support/concerns/questions   Maternal Data Reason for exclusion: Mother's choice to formula and breast feed on  admission Has patient been taught Hand Expression?: Yes  Feeding Feeding Type: Breast Fed  LATCH Score Latch: Grasps breast easily, tongue down, lips flanged, rhythmical sucking.  Audible Swallowing: Spontaneous and intermittent  Type of Nipple: Flat  Comfort (Breast/Nipple): Filling, red/small blisters or bruises, mild/mod discomfort  Hold (Positioning): Assistance needed to correctly position infant at breast and maintain latch.  LATCH Score: 7  Interventions Interventions: Breast feeding basics reviewed;Assisted with latch;Skin to skin;Breast massage;Adjust position;Support pillows;Position options;Expressed milk  Lactation Tools Discussed/Used Tools: Pump Nipple shield size: 20 Breast pump type: Double-Electric Breast Pump   Consult Status Consult Status: Follow-up Date: 02/04/20 Follow-up type: In-patient    Yitzchok Carriger A Higuera Ancidey 02/03/2020, 9:25 PM

## 2020-02-04 DIAGNOSIS — Z98891 History of uterine scar from previous surgery: Secondary | ICD-10-CM

## 2020-02-04 MED ORDER — IBUPROFEN 600 MG PO TABS: 600.0000 mg | ORAL_TABLET | Freq: Four times a day (QID) | ORAL | 0 refills | Status: AC

## 2020-02-04 MED ORDER — OXYCODONE HCL 5 MG PO TABS
5.0000 mg | ORAL_TABLET | ORAL | 0 refills | Status: AC | PRN
Start: 2020-02-04 — End: ?

## 2020-02-04 MED ORDER — POLYSACCHARIDE IRON COMPLEX 150 MG PO CAPS: 150.0000 mg | ORAL_CAPSULE | Freq: Every day | ORAL | 1 refills | Status: AC

## 2020-02-04 MED ORDER — ACETAMINOPHEN 500 MG PO TABS: 1000.0000 mg | ORAL_TABLET | Freq: Four times a day (QID) | ORAL | 0 refills | Status: AC | PRN

## 2020-02-04 NOTE — Discharge Summary (Signed)
OB Discharge Summary  Patient Name: Suzanne Flores DOB: Nov 10, 1995 MRN: [id number]  Date of admission: 02/02/2020 Delivering provider: Steva Ready   Admitting diagnosis: Previous cesarean section [Z98.891] Intrauterine pregnancy: [redacted]w[redacted]d     Secondary diagnosis: Patient Active Problem List   Diagnosis Date Noted   Status post repeat low transverse cesarean section 11/14 02/04/2020   Postpartum care following cesarean delivery 11/14 02/04/2020   Previous cesarean section 02/02/2020   Anemia of pregnancy 11/23/2016     Date of discharge: 02/04/2020   Discharge diagnosis: Principal Problem:   Postpartum care following cesarean delivery 11/14 Active Problems:   Anemia of pregnancy   Previous cesarean section   Status post repeat low transverse cesarean section 11/14                                                              Post partum procedures:None  Augmentation: N/A Pain control: Spinal  Laceration:None  Episiotomy:None  Complications: None  Hospital course:  Onset of Labor With Unplanned C/S   24 y.o. yo [id number] at [redacted]w[redacted]d was admitted in Latent Labor on 02/02/2020. Patient had a labor course significant for previous cesarean section. The patient went for cesarean section due to Elective Repeat. Delivery details as follows: Membrane Rupture Time/Date: 6:10 AM ,02/02/2020   Delivery Method:C-Section, Low Transverse  Details of operation can be found in separate operative note. Patient had an uncomplicated postpartum course.  She is ambulating,tolerating a regular diet, passing flatus, and urinating well.  Patient is discharged home in stable condition 02/04/20.  Newborn Data: Birth date:02/02/2020  Birth time:7:16 AM  Gender:Female  Living status:Living  Apgars:9 ,9  Weight:3020 g   Physical exam  Vitals:   02/03/20 0531 02/03/20 1327 02/03/20 2320 02/04/20 0613  BP: 121/66 115/67 118/62 114/73  Pulse: 87 (!) 102 96 83  Resp: 16 16 18 18   Temp: 98 F (36.7 C)  98.1 F (36.7 C) 98.4 F (36.9 C) 98.3 F (36.8 C)  TempSrc: Oral Oral Oral Oral  SpO2:   98%    General: alert, cooperative and no distress Lochia: appropriate Uterine Fundus: firm Incision: Healing well with no significant drainage, Dressing is clean, dry, and intact DVT Evaluation: No evidence of DVT seen on physical exam. No significant calf/ankle edema. Labs: Lab Results  Component Value Date   WBC 12.0 (H) 02/03/2020   HGB 9.1 (L) 02/03/2020   HCT 28.2 (L) 02/03/2020   MCV 80.3 02/03/2020   PLT 243 02/03/2020   CMP Latest Ref Rng & Units 02/02/2020  Glucose 70 - 99 mg/dL 89  BUN 6 - 20 mg/dL 10  Creatinine 02/04/2020 - 0.97 mg/dL 3.53  Sodium 2.99 - 242 mmol/L 135  Potassium 3.5 - 5.1 mmol/L 4.3  Chloride 98 - 111 mmol/L 105  CO2 22 - 32 mmol/L 20(L)  Calcium 8.9 - 10.3 mg/dL 9.3  Total Protein 6.5 - 8.1 g/dL -  Total Bilirubin 0.3 - 1.2 mg/dL -  Alkaline Phos 38 - 683 U/L -  AST 15 - 41 U/L -  ALT 0 - 44 U/L -   Edinburgh Postnatal Depression Scale Screening Tool 02/03/2020  I have been able to laugh and see the funny side of things. (No Data)    Vaccines: TDaP UTD  Flu    UTD         COVID-19   UTD  Discharge instructions:  per After Visit Summary  After Visit Meds:  Allergies as of 02/04/2020      Reactions   Tape Other (See Comments)   Redness on c-section incision site   Avocado Rash   Cinnamon Rash      Medication List    STOP taking these medications   aspirin 81 MG chewable tablet   polyethylene glycol powder 17 GM/SCOOP powder Commonly known as: GLYCOLAX/MIRALAX     TAKE these medications   acetaminophen 500 MG tablet Commonly known as: TYLENOL Take 2 tablets (1,000 mg total) by mouth every 6 (six) hours as needed for moderate pain. What changed:   how much to take  reasons to take this   ibuprofen 600 MG tablet Commonly known as: ADVIL Take 1 tablet (600 mg total) by mouth every 6 (six) hours.   iron polysaccharides 150  MG capsule Commonly known as: Ferrex 150 Take 1 capsule (150 mg total) by mouth daily.   oxyCODONE 5 MG immediate release tablet Commonly known as: Oxy IR/ROXICODONE Take 1-2 tablets (5-10 mg total) by mouth every 4 (four) hours as needed for moderate pain (not relieved by acetaminophen).   Prenatal Complete 14-0.4 MG Tabs Take 1 tablet daily       Diet: routine diet  Activity: Advance as tolerated. Pelvic rest for 6 weeks.   Newborn Data: Live born female  Birth Weight: 6 lb 10.5 oz (3020 g) APGAR: 9, 9  Newborn Delivery   Birth date/time: 02/02/2020 07:16:00 Delivery type: C-Section, Low Transverse Trial of labor: No C-section categorization: Repeat       Named DJ Baby Feeding: Breast Disposition:home with mother  Delivery Report:  Review the Delivery Report for details.    Follow up:  Follow-up Information    [facility]. Schedule an appointment as soon as possible for a visit in 6 week(s).   Specialty: Obstetrics and Gynecology Contact information: [address] [place] [address] [phone]              June Leap, CNM, MSN 02/04/2020, 1:14 PM

## 2020-02-04 NOTE — Lactation Note (Signed)
This note was copied from a baby's chart. Lactation Consultation Note  Patient Name: Suzanne Flores INOMV'E Date: 02/04/2020 Reason for consult: Follow-up assessment;Difficult latch;Early term 37-38.6wks  1022 - 1035 - I followed up with [name] and conducted discharged education. She reports that [name] has been latching well with the use of a nipple shield. She held it up to indicate that there was milk residue in the NS. Baby has been primarily breast feeding with one supplementation of formula by curved tip syringe at 0100. All breast feeds since that time.   [name] has [facility] but was told that she was not eligible for a breast pump at this time because baby is breast feeding. She has a personal pump but commented that one flange does not have suction. I checked it and noted that the membrane was torn. We replaced the membrane using one from her DEBP kit.  [name] states that baby prefers to breast feed on the left breast but not on the right. We recommended that she continue to pump that side and she can supplement with her milk. She has been using the DEBP, and she states that her milk is transitioning.  We discussed day 2 and day 3 infant feeding patterns. I recommended that she breast feed on demand 8-12 times a day. We discussed signs that baby is getting enough to eat, and she indicated that baby is getting good output thus far.  She will follow up with [facility]. I conducted discharged education including how to manage engorgement. I encouraged her to take advantage of an OP appointment to help her since baby is preferring one breast, and she is using a nipple shield. I offered to put in a referral, and she consented to a referral.   [name] is following up with [facility] tomorrow.   Maternal Data Formula Feeding for Exclusion: No Has patient been taught Hand Expression?: Yes  Feeding Feeding Type: Breast Fed   Lactation Tools Discussed/Used WIC  Program: Yes Pump Review: Setup, frequency, and cleaning   Consult Status Consult Status: Complete Date: 02/04/20 Follow-up type: In-patient    Lenore Manner 02/04/2020, 10:55 AM

## 2020-02-05 ENCOUNTER — Other Ambulatory Visit (HOSPITAL_COMMUNITY): Payer: Medicaid Other

## 2020-02-05 ENCOUNTER — Other Ambulatory Visit (HOSPITAL_COMMUNITY)
Admission: RE | Admit: 2020-02-05 | Discharge: 2020-02-05 | Disposition: A | Discharge status: Home / Self Care | Payer: Medicaid Other | Source: Ambulatory Visit | Attending: Obstetrics and Gynecology | Admitting: Obstetrics and Gynecology

## 2020-02-05 HISTORY — DX: Supervision of pregnancy with other poor reproductive or obstetric history, unspecified trimester: O09.299

## 2020-02-07 ENCOUNTER — Inpatient Hospital Stay (HOSPITAL_COMMUNITY)
Admission: RE | Admit: 2020-02-07 | Payer: Medicaid Other | Source: Home / Self Care | Admitting: Obstetrics and Gynecology

## 2022-01-01 IMAGING — US US OB < 14 WEEKS - US OB TV
1 series · 14 of 28 positions shown · non-contrast
Comparison: June 22, 2017.

CLINICAL DATA: Pelvic pain.

EXAM:
OBSTETRIC <14 WK US AND TRANSVAGINAL OB US
TECHNIQUE: Both transabdominal and transvaginal ultrasound examinations were
performed for complete evaluation of the gestation as well as the
maternal uterus, adnexal regions, and pelvic cul-de-sac.
Transvaginal technique was performed to assess early pregnancy.

[Series 1: us ob < 14 weeks - us ob tv · 14 of 67 slices shown]
[im 3/67]
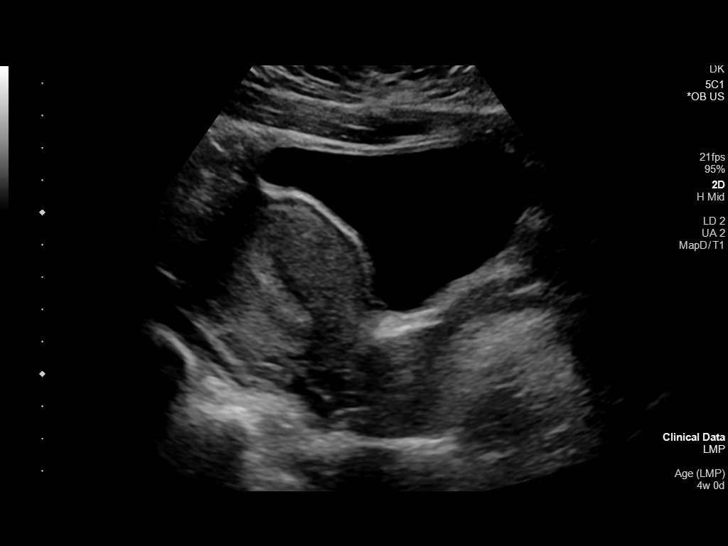
[im 8/67]
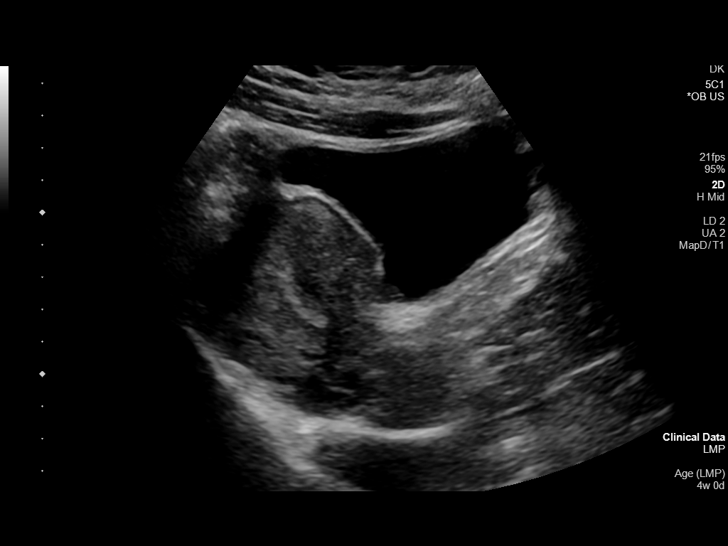
[im 13/67]
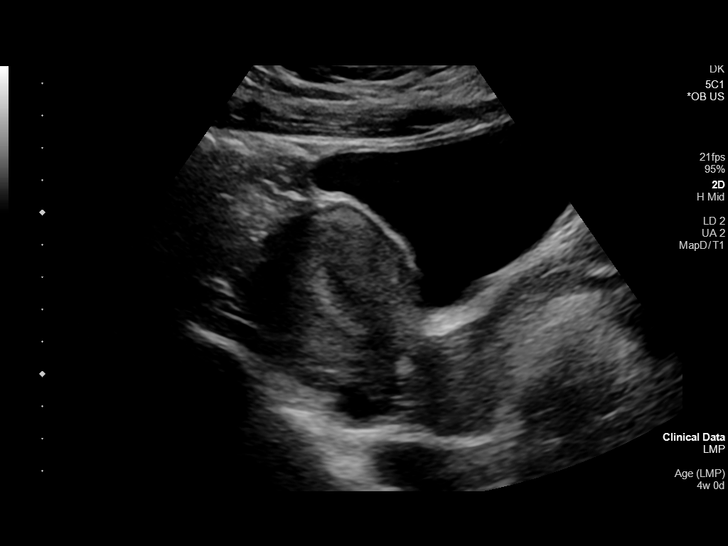
[im 18/67]
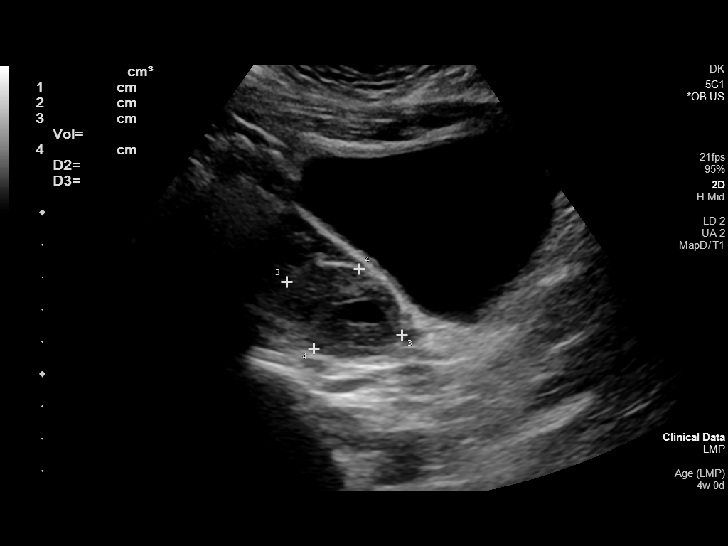
[im 23/67]
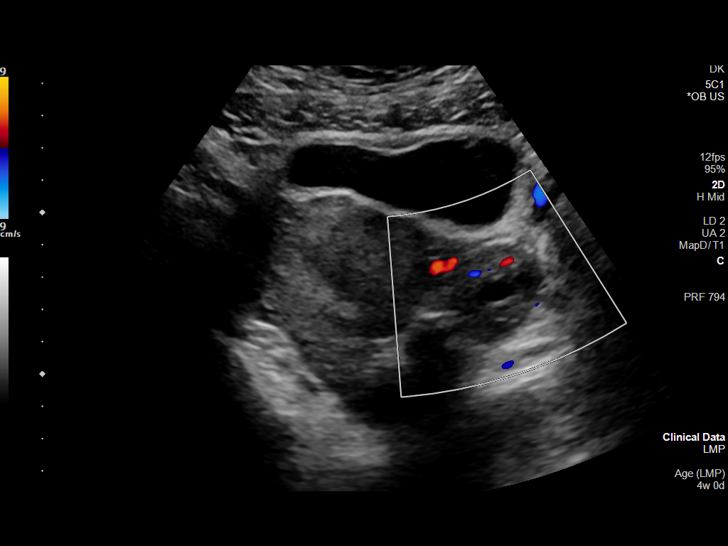
[im 27/67]
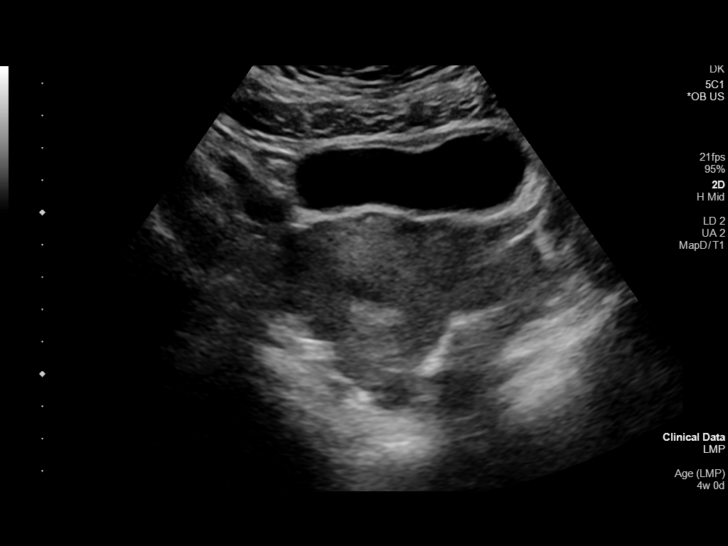
[im 32/67]
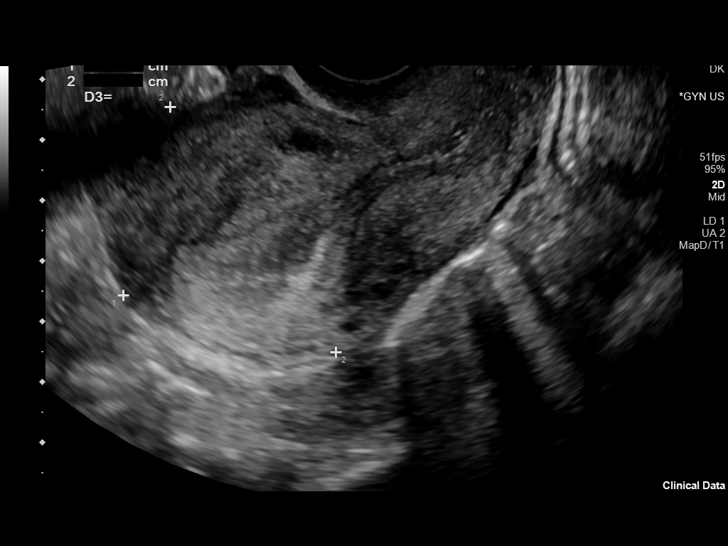
[im 37/67]
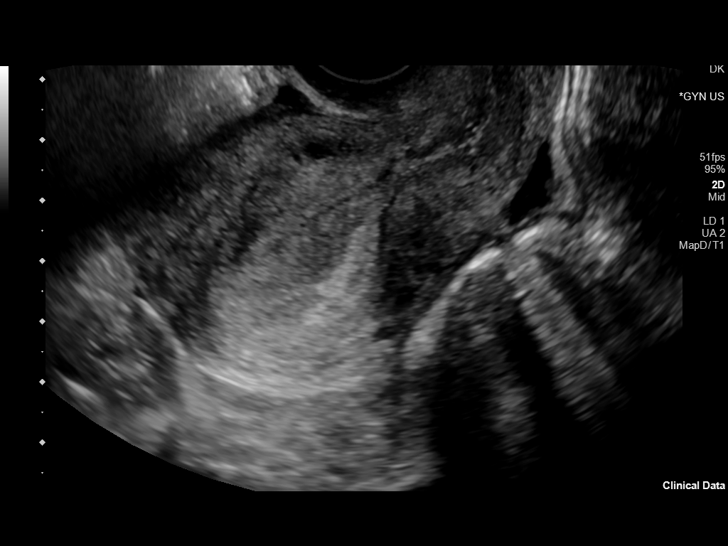
[im 42/67]
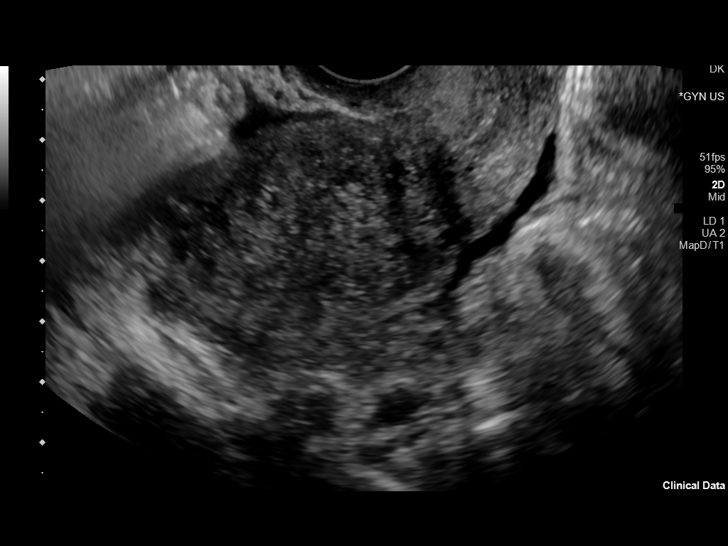
[im 47/67]
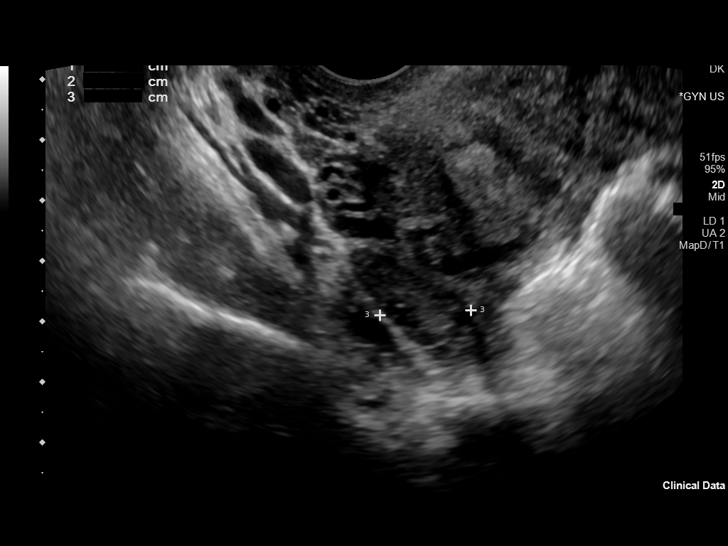
[im 52/67]
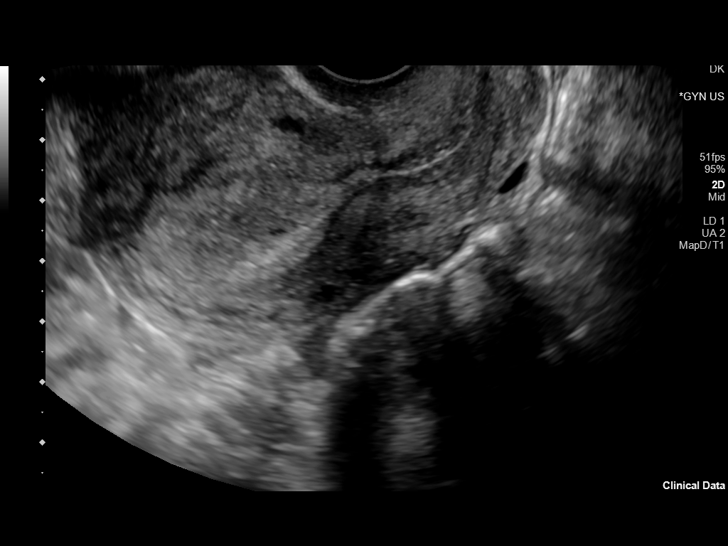
[im 57/67]
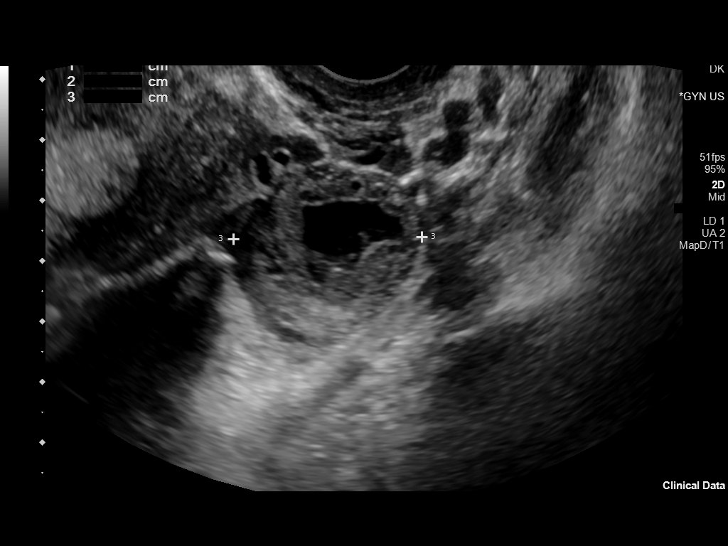
[im 62/67]
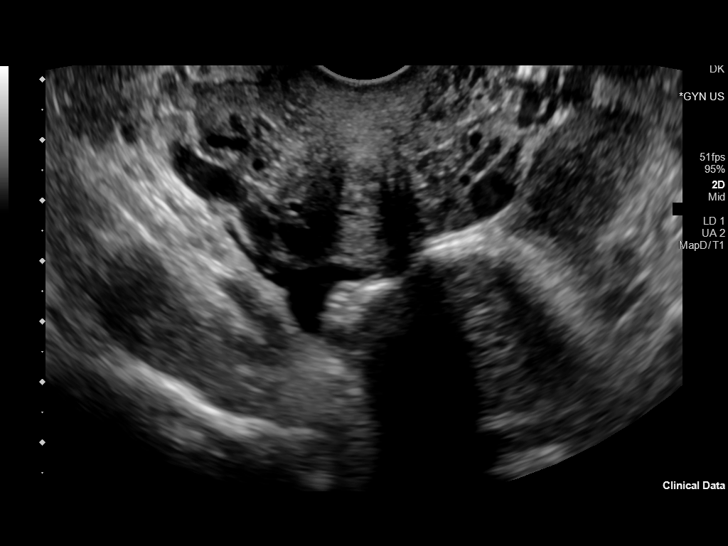
[im 67/67]
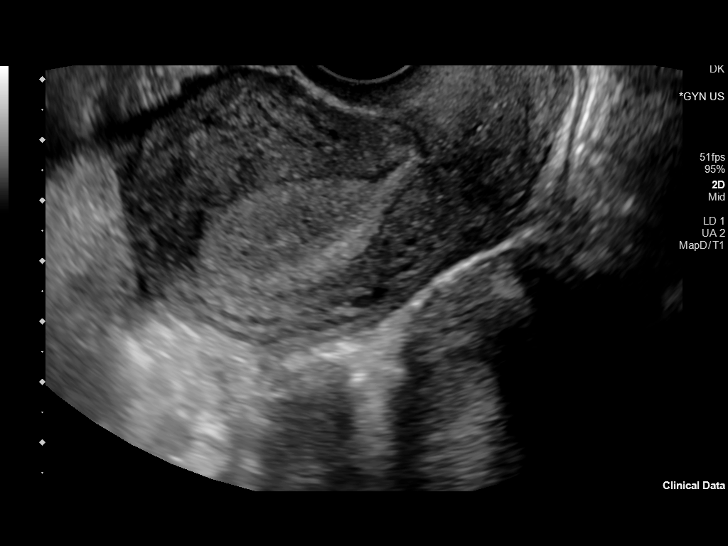

[14 of 28 positions shown; findings below may reference images not displayed]

FINDINGS: Intrauterine gestational sac: None

Yolk sac:  Not Visualized.

Embryo:  Not Visualized.

Maternal uterus/adnexae: Probable corpus luteum cyst seen in left
ovary. Right ovary is normal. Trace free fluid is noted which most
likely is physiologic. Thickened endometrium is noted at 16 mm.
IMPRESSION: No intrauterine gestational sac, yolk sac, fetal pole, or cardiac
activity visualized. Differential considerations include
intrauterine gestation too early to be sonographically visualized,
spontaneous abortion, or ectopic pregnancy. Consider follow-up
ultrasound in 14 days and serial quantitative beta HCG follow-up.

## 2022-01-09 IMAGING — US US OB TRANSVAGINAL
1 series · 15 of 28 positions shown · non-contrast
Comparison: None.

CLINICAL DATA: Pregnant patient. Pregnancy of unknown location.
Pelvic pain.

EXAM:
OBSTETRIC <14 WK US AND TRANSVAGINAL OB US
TECHNIQUE: Both transabdominal and transvaginal ultrasound examinations were
performed for complete evaluation of the gestation as well as the
maternal uterus, adnexal regions, and pelvic cul-de-sac.
Transvaginal technique was performed to assess early pregnancy.

[Series 1: us ob transvaginal · 64 acquisitions, 15 frames shown]
[im 1/64]
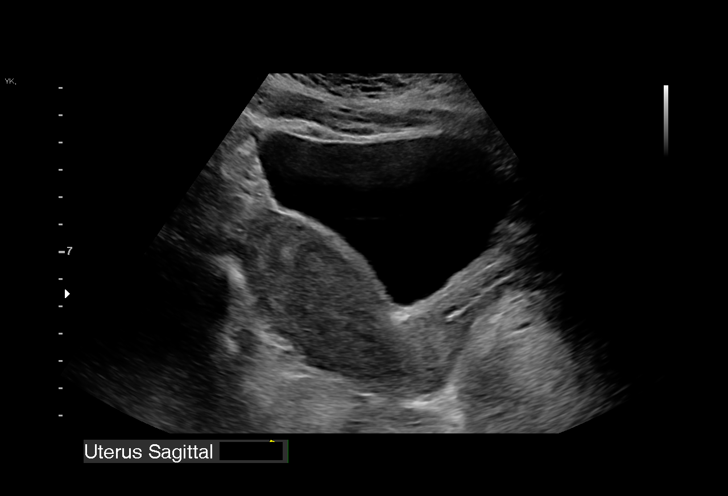
[im 5/64]
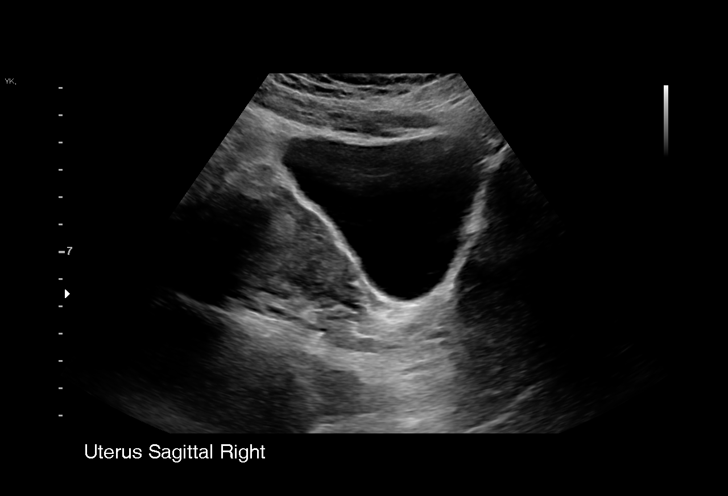
[im 10/64]
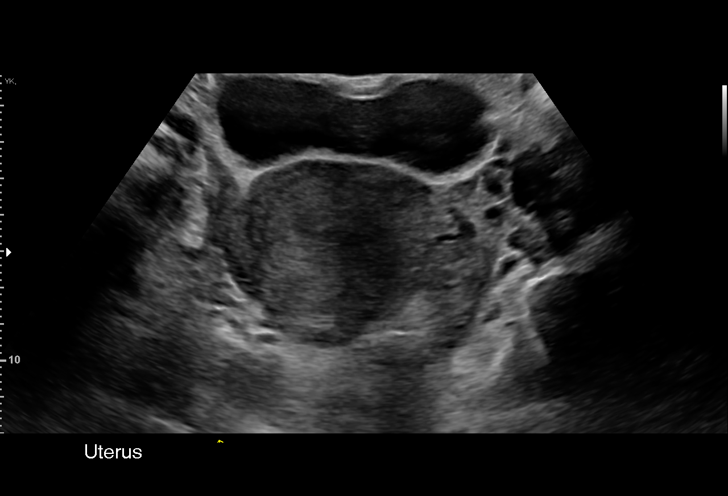
[im 15/64]
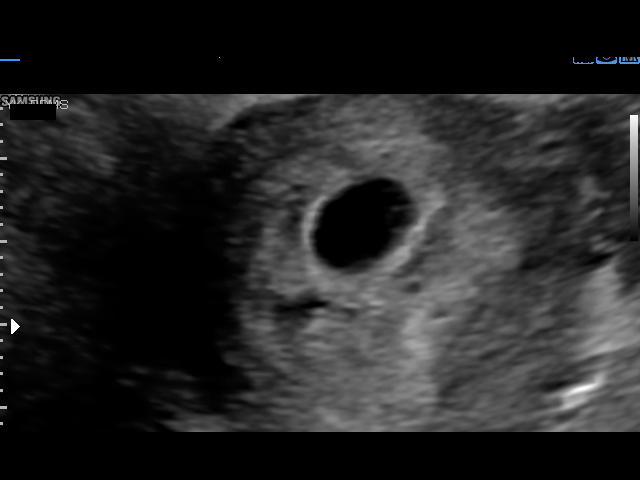
[im 19/64]
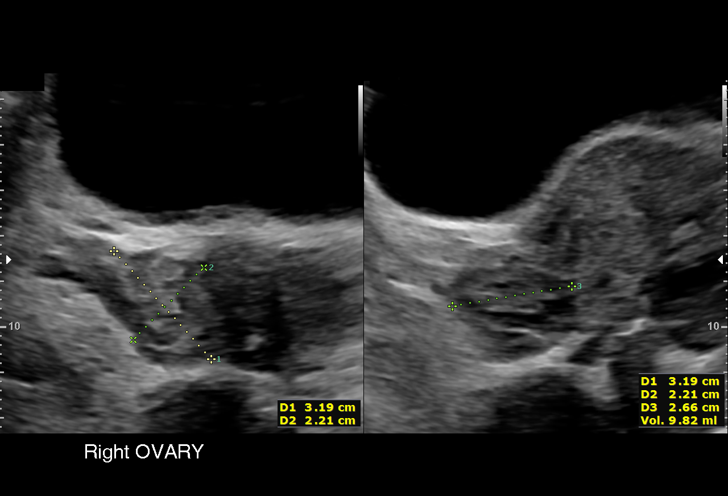
[im 24/64]
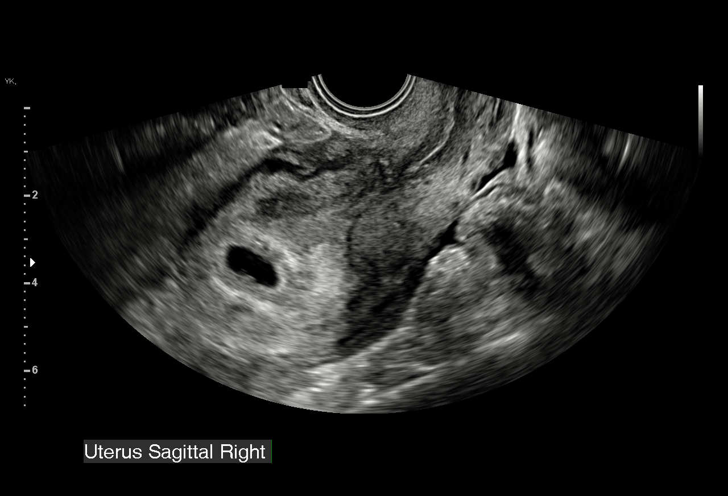
[im 29/64]
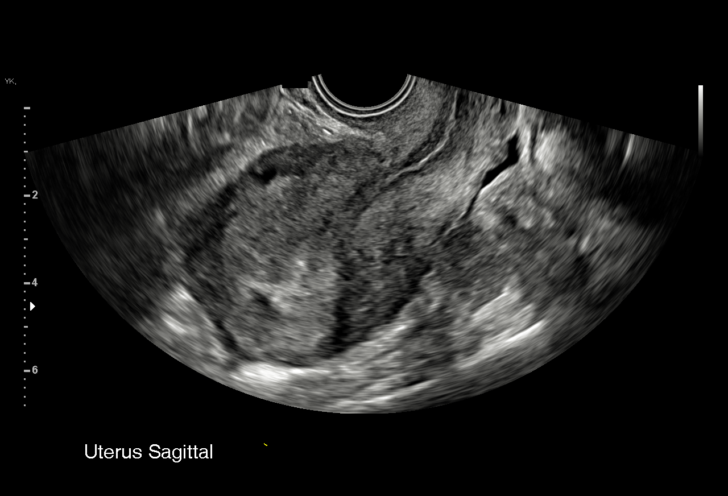
[im 33/64]
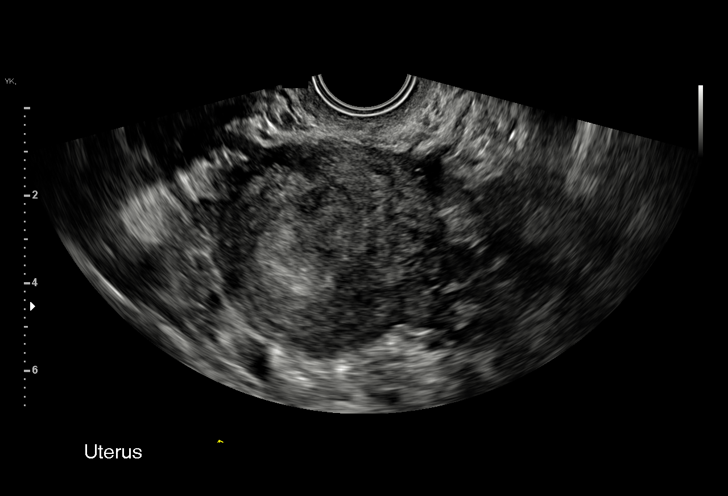
[im 36/64]
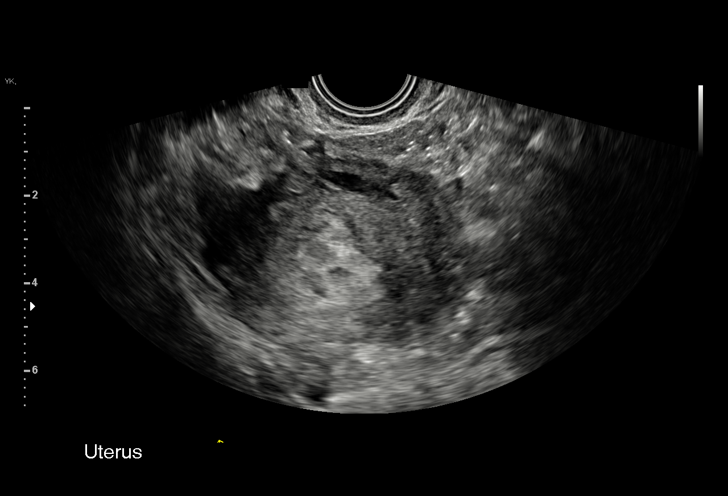
[im 40/64]
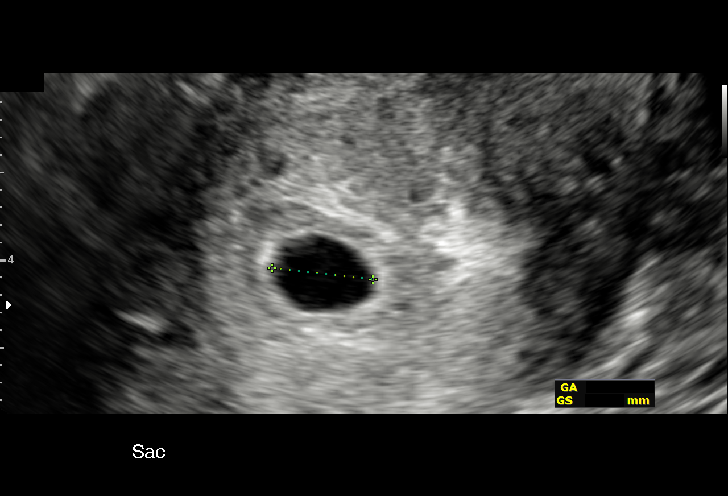
[im 45/64]
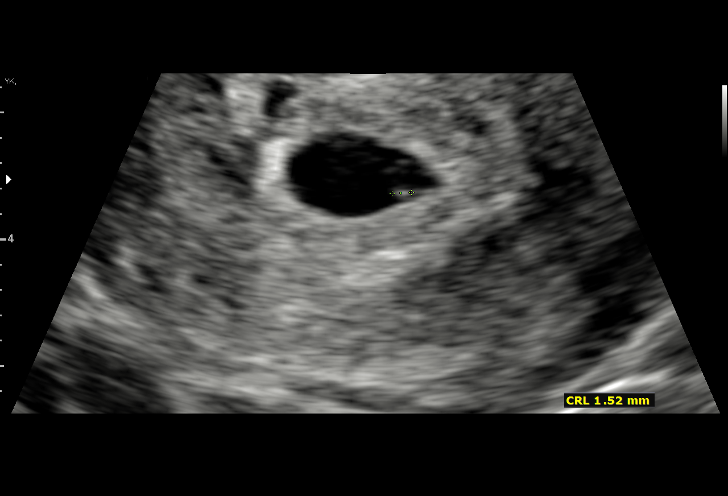
[im 50/64]
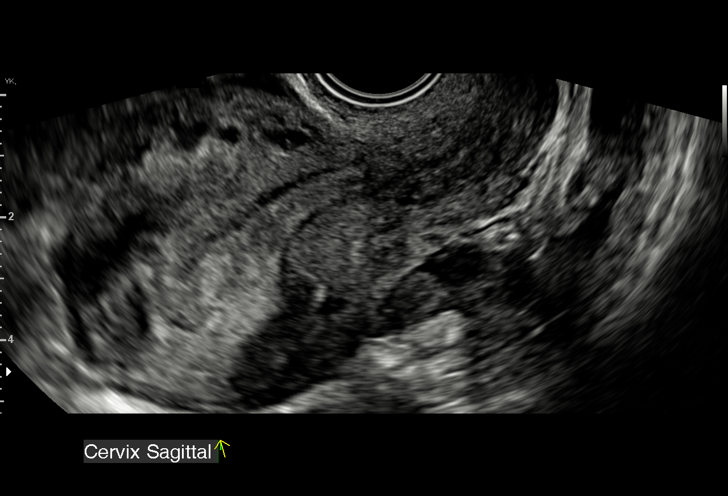
[im 54/64]
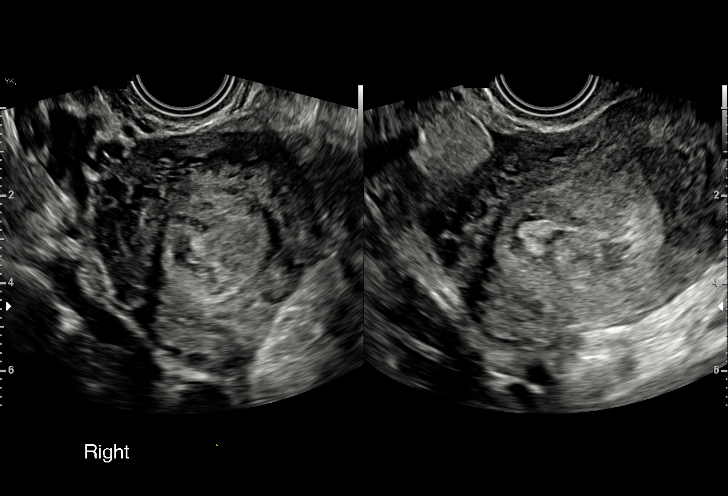
[im 59/64]
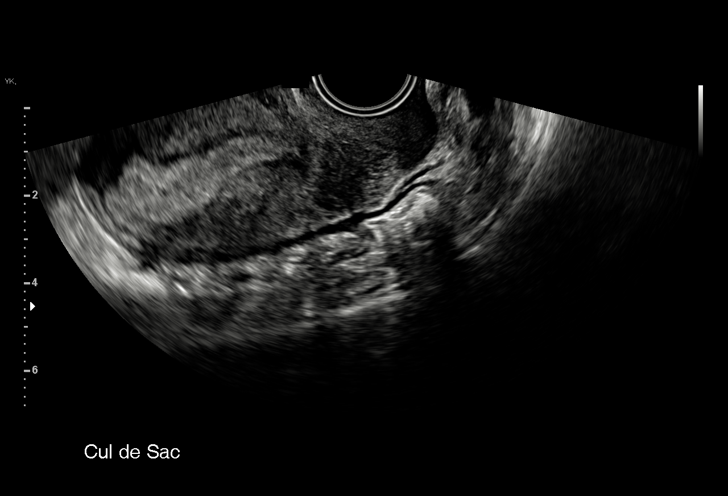
[im 64/64]
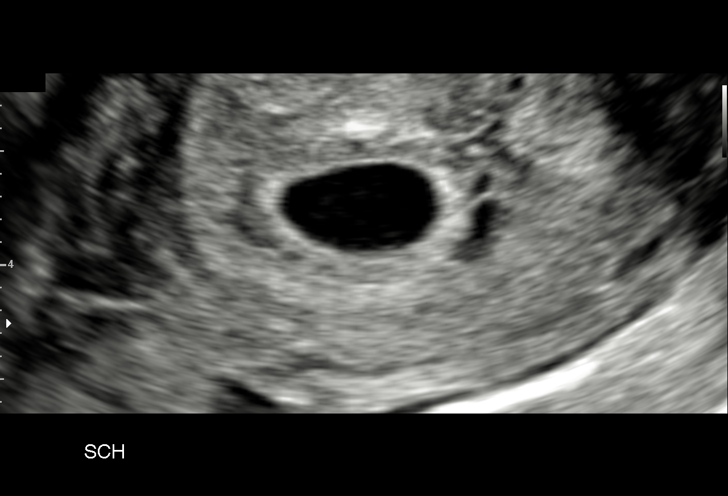

[15 of 28 positions shown; findings below may reference images not displayed]

FINDINGS: Intrauterine gestational sac: Single

Yolk sac:  Visualized.

Embryo:  Not Visualized.

Cardiac Activity: Not Visualized.

MSD: 10.8 mm   5 w   5 d

Subchorionic hemorrhage:  Small

Maternal uterus/adnexae: Normal right and left ovaries. Trace free
fluid in the pelvis.
IMPRESSION: Early intrauterine gestational sac and yolk sac but no definite
fetal pole or cardiac activity yet visualized. Recommend follow-up
quantitative B-HCG levels and follow-up US in 14 days to assess
viability. This recommendation follows SRU consensus guidelines:
Diagnostic Criteria for Nonviable Pregnancy Early in the First
Trimester. N Engl J Med 2257; [DATE].

## 2022-01-21 IMAGING — US US BREAST*R* LIMITED INC AXILLA
1 series · 2 of 2 positions shown · non-contrast
Comparison: None.

CLINICAL DATA: Palpable abnormality in the RIGHT breast at 12
location, first noted 2-3 weeks ago. Patient is 7 weeks pregnant. No
family history of breast cancer. No previous surgeries.

EXAM:
ULTRASOUND OF THE RIGHT BREAST

[Series 1: us breast*right* limited inc axilla · 0.07mm/px · 2 of 2 slices shown]
[im 1/2]
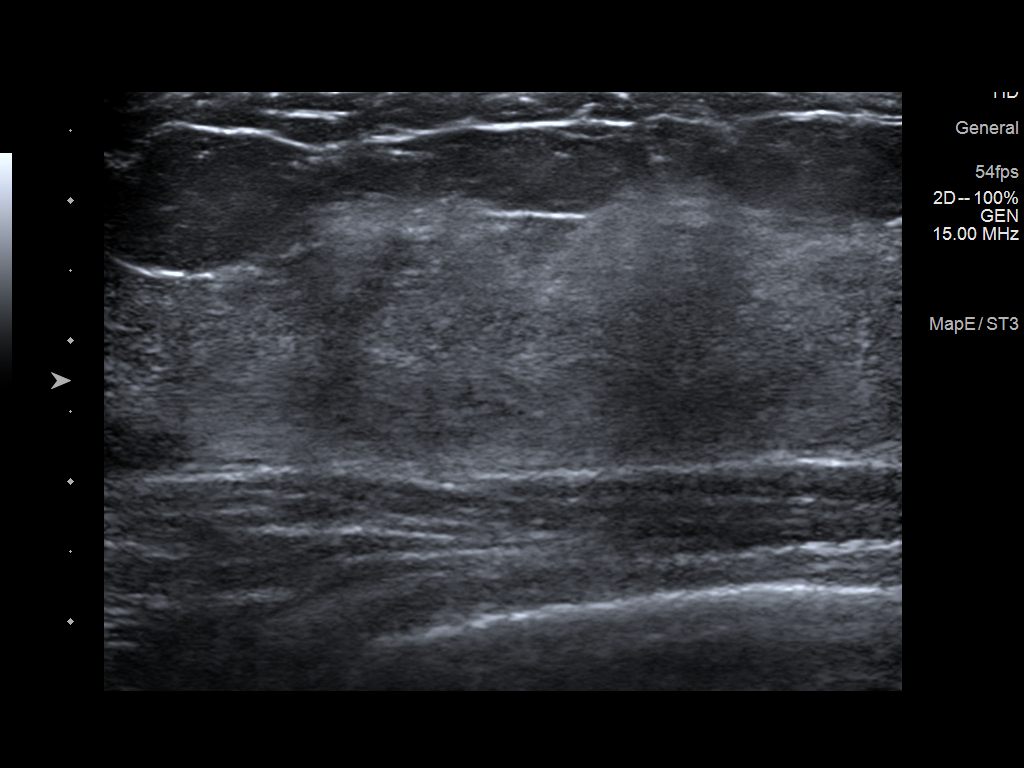
[im 2/2]
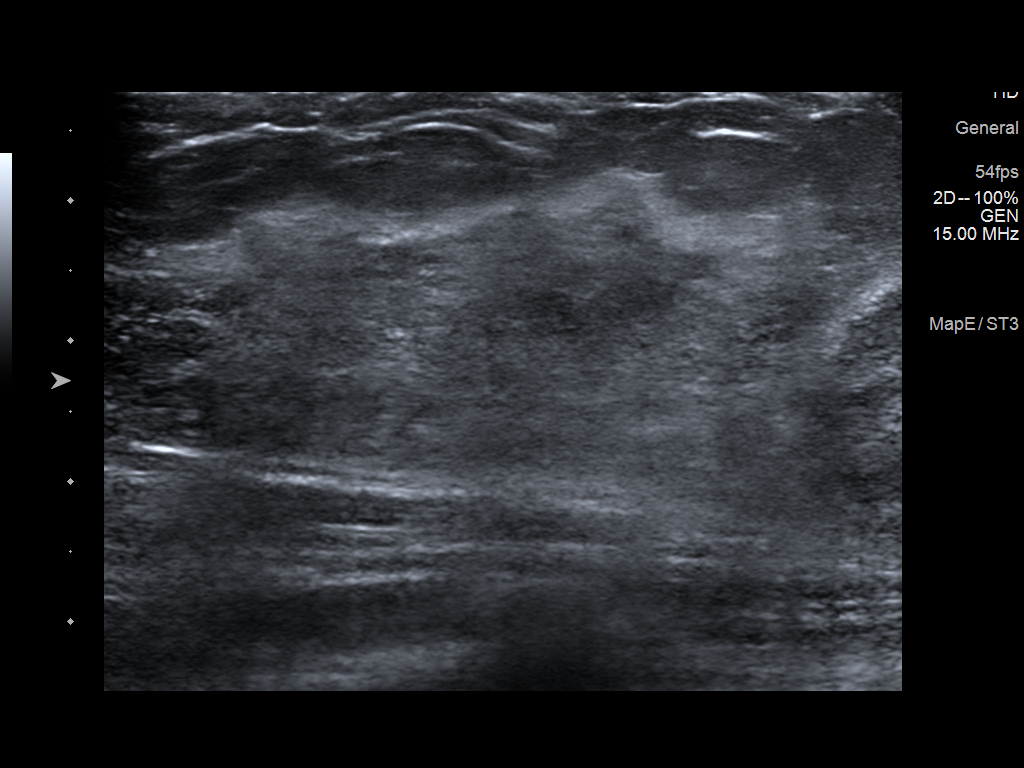

[2 of 2 positions shown; findings below may reference images not displayed]

FINDINGS: On physical exam, I palpate soft focal thickening in the 12 o'clock
location of the RIGHT breast, not associated with a discrete mass.

Targeted ultrasound is performed, showing normal appearing dense
fibroglandular tissue in the 12 o'clock location of the RIGHT
breast. No suspicious mass, distortion, or acoustic shadowing is
demonstrated with ultrasound.
IMPRESSION: Palpable abnormality in the RIGHT breast is dense fibroglandular
tissue on ultrasound evaluation. No ultrasound evidence for
malignancy.

RECOMMENDATION:
Screening mammogram at age 41 unless there are persistent or
intervening clinical concerns. (Code:SA-[id number])

I have discussed the findings and recommendations with the patient.
If applicable, a reminder letter will be sent to the patient
regarding the next appointment.

BI-RADS CATEGORY  1: Negative.

## 2022-08-28 IMAGING — US US MFM FETAL BPP W/O NON-STRESS
1 series · 15 of 28 positions shown · non-contrast
Comparison: none

[Series 1: us mfm fetal bpp w/o non-stress · 30 acquisitions, 15 frames shown]
[im 1/30]
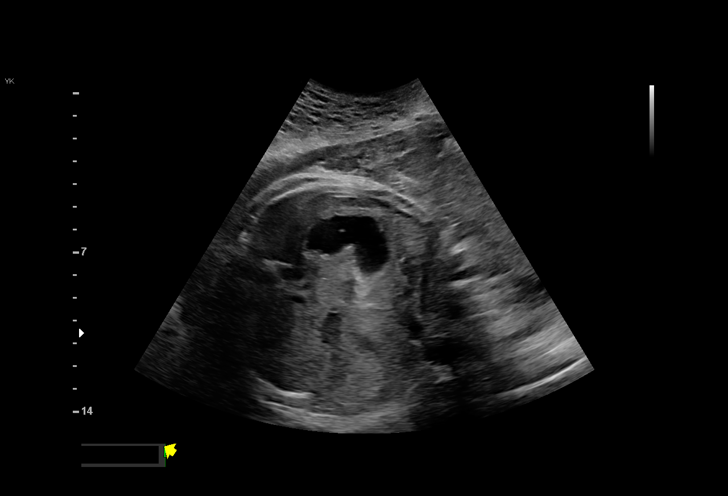
[im 3/30]
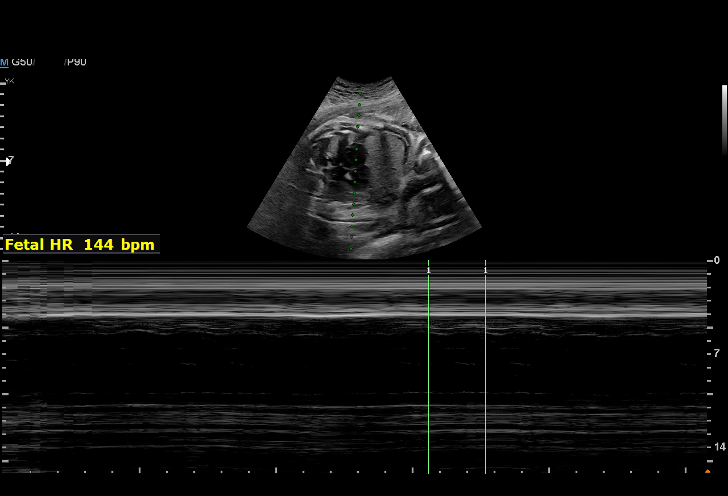
[im 5/30]
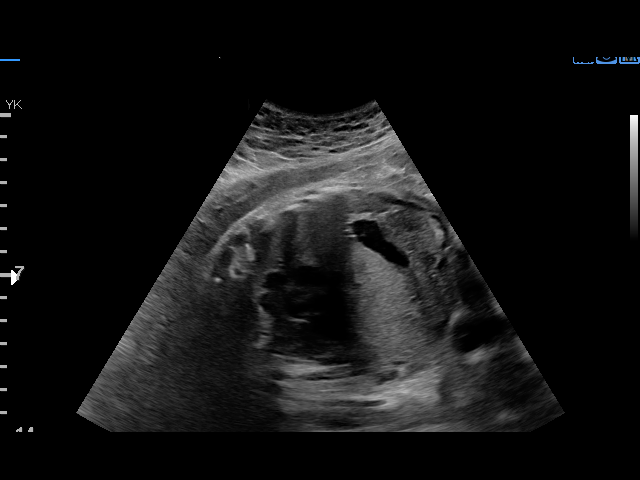
[im 7/30]
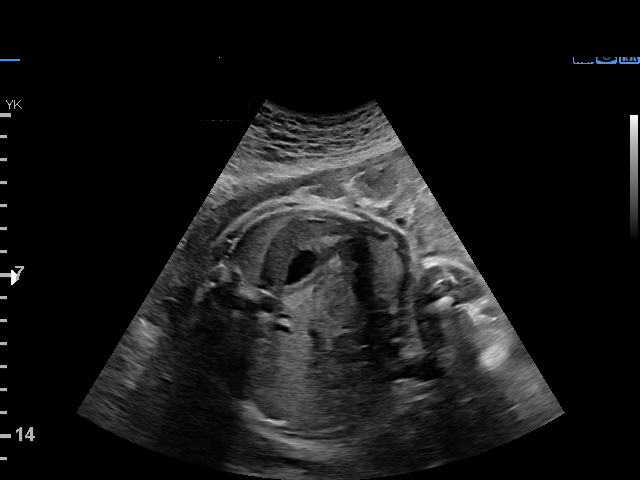
[im 9/30]
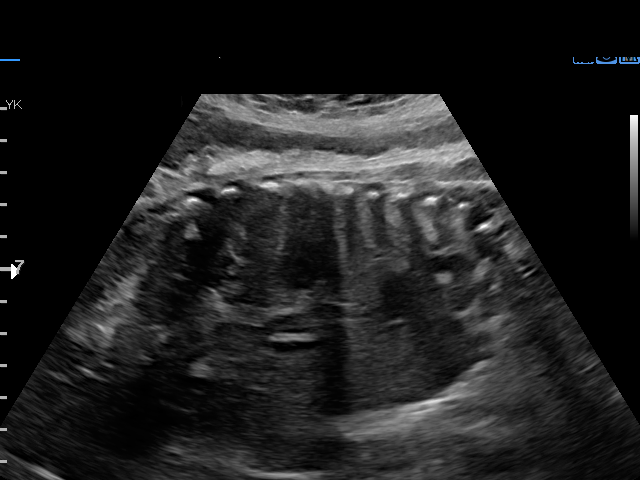
[im 11/30]
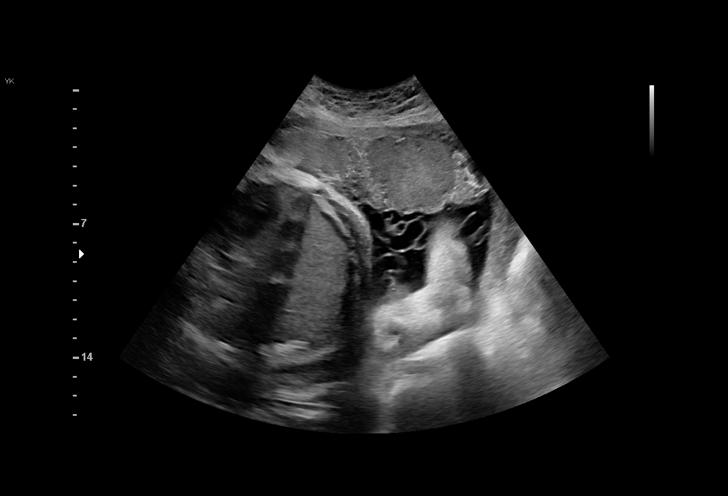
[im 13/30]
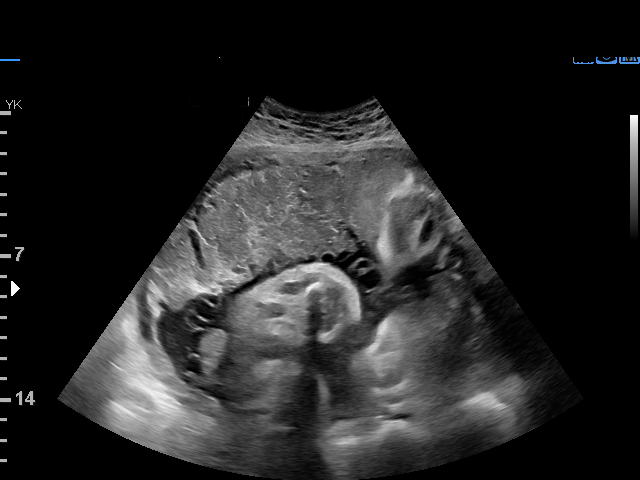
[im 16/30]
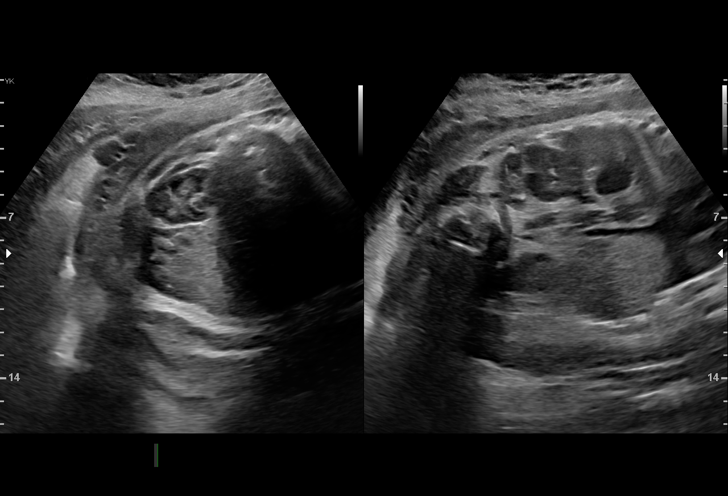
[im 17/30]
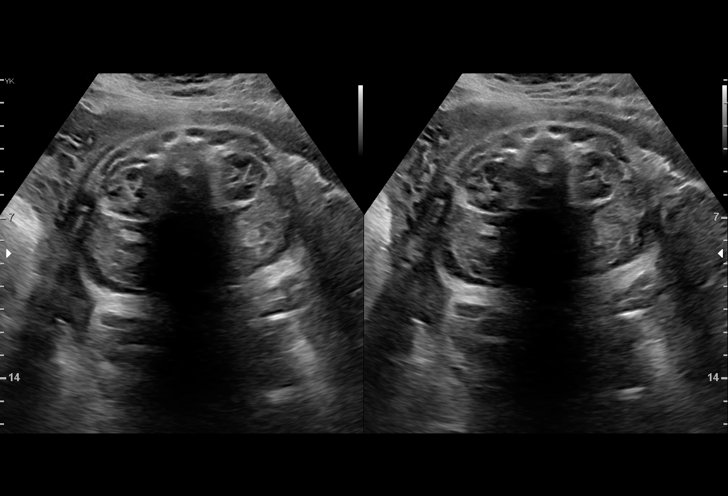
[im 19/30]
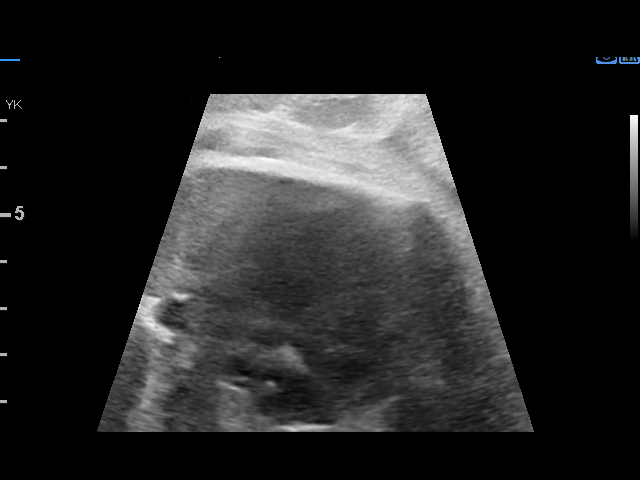
[im 21/30]
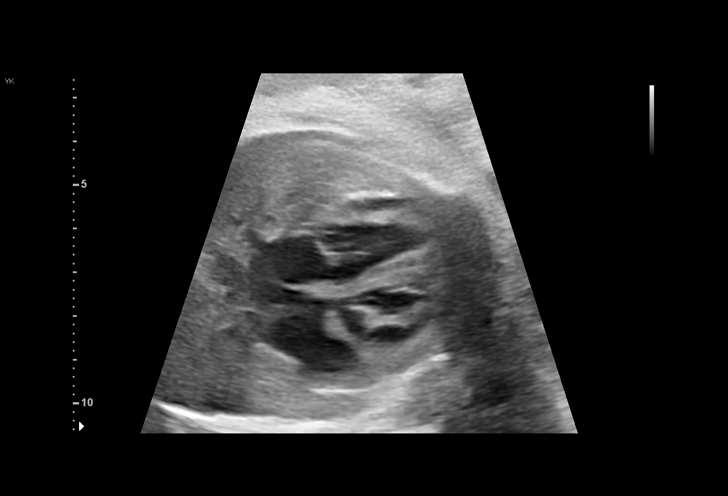
[im 23/30]
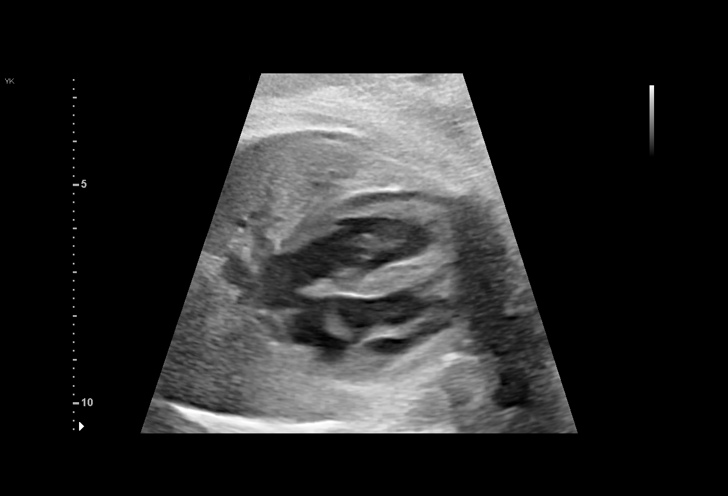
[im 25/30]
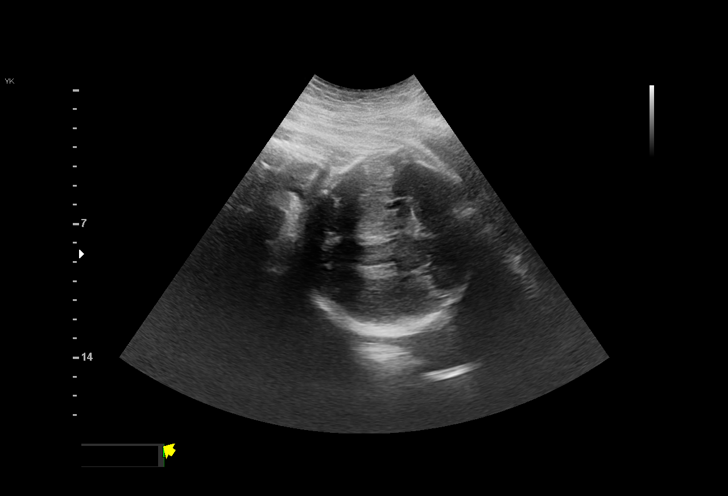
[im 27/30]
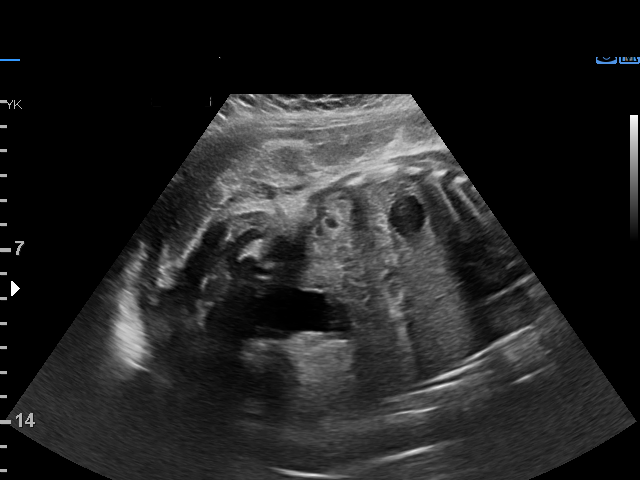
[im 30/30]
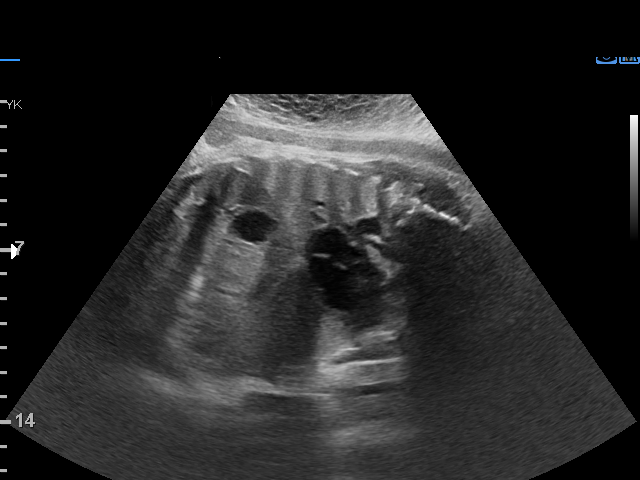

[15 of 28 positions shown; findings below may reference images not displayed]

Obstetrics &
                                                             Gynecology
                                                             [id number]
                                                             [place].

Indications

 Antenatal follow-up for nonvisualized fetal
 anatomy
 Obesity complicating pregnancy, second
 trimester (Pre Preg BMI 32.07)
 History of cesarean delivery, currently
 pregnant
 Neg NIPS
 Poor obstetric history: Previous
 preeclampsia / eclampsia/gestational HTN
 38 weeks gestation of pregnancy
Fetal Evaluation

 Num Of Fetuses:          1
 Fetal Heart Rate(bpm):   144
 Cardiac Activity:        Observed
 Presentation:            Cephalic
 Placenta:                Anterior
 P. Cord Insertion:       Previously Visualized

 Amniotic Fluid
 AFI FV:      Within normal limits

 AFI Sum(cm)     %Tile       Largest Pocket(cm)
 12.13           42          5
 RUQ(cm)       RLQ(cm)       LUQ(cm)        LLQ(cm)
 1.61          1.71          3.81           5
Biophysical Evaluation

 Amniotic F.V:   Within normal limits       F. Tone:         Observed
 F. Movement:    Observed                   Score:           [DATE]
 F. Breathing:   Observed
OB History

 Gravidity:    2         Term:   1
 Living:       1
Gestational Age

 LMP:           38w 1d        Date:  05/10/19                 EDD:   02/14/20
 Best:          38w 1d     Det. By:  LMP  (05/10/19)          EDD:   02/14/20
Anatomy

 Cranium:               Appears normal         Kidneys:                Appear normal
 Heart:                 Appears normal         Bladder:                Appears normal
                        (4CH, axis, and
                        situs)
 Stomach:               Appears normal, left
                        sided
Impression

 Antenatal testing
 Biophysical profile [DATE] with good fetal movement and
 amniotic fluid volume.
Recommendations

 Follow up as clinically indicated.

## 2023-01-05 ENCOUNTER — Other Ambulatory Visit: Payer: Self-pay | Admitting: Obstetrics and Gynecology

## 2023-01-05 DIAGNOSIS — N6310 Unspecified lump in the right breast, unspecified quadrant: Secondary | ICD-10-CM

## 2023-01-13 ENCOUNTER — Ambulatory Visit
Admission: RE | Admit: 2023-01-13 | Discharge: 2023-01-13 | Disposition: A | Discharge status: Home / Self Care | Payer: 59 | Source: Ambulatory Visit | Attending: Obstetrics and Gynecology | Admitting: Obstetrics and Gynecology

## 2023-01-13 DIAGNOSIS — N6310 Unspecified lump in the right breast, unspecified quadrant: Secondary | ICD-10-CM
